# Patient Record
Sex: Male | Born: 1935 | Race: White | Hispanic: No | Marital: Married | Smoking: Former smoker
Health system: Southern US, Community
[De-identification: ages and names within clinical notes are randomized; demographics above are authoritative.]

## PROBLEM LIST (undated history)

## (undated) DIAGNOSIS — I10 Essential (primary) hypertension: Secondary | ICD-10-CM

## (undated) DIAGNOSIS — J449 Chronic obstructive pulmonary disease, unspecified: Secondary | ICD-10-CM

## (undated) DIAGNOSIS — N400 Enlarged prostate without lower urinary tract symptoms: Secondary | ICD-10-CM

## (undated) DIAGNOSIS — E785 Hyperlipidemia, unspecified: Secondary | ICD-10-CM

## (undated) DIAGNOSIS — N289 Disorder of kidney and ureter, unspecified: Secondary | ICD-10-CM

---

## 2007-12-03 ENCOUNTER — Ambulatory Visit: Payer: Self-pay

## 2008-04-22 ENCOUNTER — Ambulatory Visit: Payer: Self-pay

## 2013-10-30 ENCOUNTER — Emergency Department: Payer: Self-pay | Admitting: Emergency Medicine

## 2013-10-30 LAB — CBC WITH DIFFERENTIAL/PLATELET
BASOS PCT: 1.1 %
Basophil #: 0.1 10*3/uL (ref 0.0–0.1)
Eosinophil #: 0.2 10*3/uL (ref 0.0–0.7)
Eosinophil %: 2.3 %
HCT: 51.5 % (ref 40.0–52.0)
HGB: 16.6 g/dL (ref 13.0–18.0)
Lymphocyte #: 1.9 10*3/uL (ref 1.0–3.6)
Lymphocyte %: 27 %
MCH: 29.9 pg (ref 26.0–34.0)
MCHC: 32.2 g/dL (ref 32.0–36.0)
MCV: 93 fL (ref 80–100)
Monocyte #: 0.7 x10 3/mm (ref 0.2–1.0)
Monocyte %: 10.6 %
NEUTROS PCT: 59 %
Neutrophil #: 4.1 10*3/uL (ref 1.4–6.5)
Platelet: 124 10*3/uL — ABNORMAL LOW (ref 150–440)
RBC: 5.55 10*6/uL (ref 4.40–5.90)
RDW: 13.9 % (ref 11.5–14.5)
WBC: 6.9 10*3/uL (ref 3.8–10.6)

## 2013-10-30 LAB — COMPREHENSIVE METABOLIC PANEL
AST: 25 U/L (ref 15–37)
Albumin: 3.8 g/dL (ref 3.4–5.0)
Alkaline Phosphatase: 85 U/L
Anion Gap: 6 — ABNORMAL LOW (ref 7–16)
BUN: 16 mg/dL (ref 7–18)
Bilirubin,Total: 0.7 mg/dL (ref 0.2–1.0)
CHLORIDE: 108 mmol/L — AB (ref 98–107)
CREATININE: 1 mg/dL (ref 0.60–1.30)
Calcium, Total: 9.4 mg/dL (ref 8.5–10.1)
Co2: 28 mmol/L (ref 21–32)
EGFR (African American): >60
EGFR (Non-African Amer.): >60
GLUCOSE: 129 mg/dL — AB (ref 65–99)
Osmolality: 286 (ref 275–301)
Potassium: 4 mmol/L (ref 3.5–5.1)
SGPT (ALT): 24 U/L (ref 12–78)
SODIUM: 142 mmol/L (ref 136–145)
Total Protein: 7.6 g/dL (ref 6.4–8.2)

## 2013-10-30 LAB — PRO B NATRIURETIC PEPTIDE: B-TYPE NATIURETIC PEPTID: 1036 pg/mL — AB (ref 0–450)

## 2013-10-30 LAB — TROPONIN I: Troponin-I: 0.03 ng/mL

## 2013-10-31 ENCOUNTER — Inpatient Hospital Stay: Payer: Self-pay | Admitting: Internal Medicine

## 2013-10-31 LAB — APTT
ACTIVATED PTT: 105.1 s — AB (ref 23.6–35.9)
Activated PTT: 32.6 secs (ref 23.6–35.9)
Activated PTT: 140.4 secs — ABNORMAL HIGH (ref 23.6–35.9)

## 2013-10-31 LAB — CK TOTAL AND CKMB (NOT AT ARMC)
CK, TOTAL: 110 U/L
CK-MB: 8.3 ng/mL — ABNORMAL HIGH (ref 0.5–3.6)

## 2013-10-31 LAB — URINALYSIS, COMPLETE
Bilirubin,UR: NEGATIVE
Glucose,UR: NEGATIVE mg/dL (ref 0–75)
Ketone: NEGATIVE
LEUKOCYTE ESTERASE: NEGATIVE
Nitrite: NEGATIVE
Ph: 5 (ref 4.5–8.0)
Protein: NEGATIVE
Specific Gravity: 1.049 (ref 1.003–1.030)
Squamous Epithelial: NONE SEEN

## 2013-10-31 LAB — TROPONIN I
Troponin-I: 1.5 ng/mL — ABNORMAL HIGH
Troponin-I: 2.1 ng/mL — ABNORMAL HIGH
Troponin-I: 3 ng/mL — ABNORMAL HIGH

## 2013-10-31 LAB — CK-MB
CK-MB: 6.4 ng/mL — ABNORMAL HIGH (ref 0.5–3.6)
CK-MB: 9.3 ng/mL — ABNORMAL HIGH (ref 0.5–3.6)

## 2013-11-01 LAB — LIPID PANEL
Cholesterol: 134 mg/dL (ref 0–200)
HDL Cholesterol: 41 mg/dL (ref 40–60)
Ldl Cholesterol, Calc: 81 mg/dL (ref 0–100)
Triglycerides: 58 mg/dL (ref 0–200)
VLDL Cholesterol, Calc: 12 mg/dL (ref 5–40)

## 2013-11-01 LAB — PLATELET COUNT: PLATELETS: 107 10*3/uL — AB (ref 150–440)

## 2013-11-01 LAB — APTT

## 2013-11-01 LAB — HEMOGLOBIN: HGB: 14.8 g/dL (ref 13.0–18.0)

## 2013-11-07 ENCOUNTER — Ambulatory Visit: Payer: Self-pay | Admitting: Urology

## 2013-11-14 ENCOUNTER — Ambulatory Visit: Payer: Self-pay | Admitting: Urology

## 2013-12-03 ENCOUNTER — Ambulatory Visit: Payer: Self-pay | Admitting: Urology

## 2014-01-15 ENCOUNTER — Ambulatory Visit: Payer: Self-pay | Admitting: Urology

## 2014-12-13 NOTE — Discharge Summary (Signed)
PATIENT NAME:  Timothy Cabrera, Timothy Cabrera MR#:  [id number] DATE OF BIRTH:  Oct 27, 1921  DATE OF ADMISSION:  10/31/2013 DATE OF DISCHARGE:  11/01/2013  PRIMARY CARE PHYSICIAN:  Dr. Peter CongoSchiller.  FINAL DIAGNOSES: 1.  Type II acute myocardial infarction with demand ischemia.  2.  Abdominal pain and kidney stone.  3.  Benign prostatic hypertrophy.  4.  Chronic shortness of breath on chronic oxygen.   MEDICATIONS ON DISCHARGE:  Include Flomax 0.4 mg daily, meclizine 25 mg 3 times a day as needed for dizziness, metoprolol 25 mg extended-release 1 tablet daily, Lipitor 20 mg at bedtime, acetaminophen hydrocodone 325/5 mg 1 tablet every six hours as needed for pain.   HOME HEALTH:  Yes.  Physical therapy, nurse, nurse aide and social worker, help with meds and community resources.   TREATMENT:  Strain urine and save stone.  The patient does have home oxygen at 2 liters nasal cannula.   DIET:  Low sodium diet, regular consistency.   HOSPITAL COURSE:  The patient was admitted 10/31/2013 and discharged 11/01/2013.  This is a strange story because the patient presented with left lower abdominal pain.  He was found to have a kidney stone.  He was given pain medication.  His blood pressure was very high on presentation.  The ER physician drew a first troponin that was negative and they sent him home and then his second troponin came back and was elevated and they called him back for further evaluation.  The patient never had any chest pain.  He does chronically have shortness of breath.  He was admitted with a suspected myocardial infarction and started on heparin drip and given aspirin.  The patient was seen in consultation by Dr. Lady GaryFath covering for Dr. Darrold JunkerParaschos.  Did not recommend any aggressive cardiac testing.  Since the patient was not having chest pain he recommended medical management.   LABORATORY AND RADIOLOGICAL DATA DURING THE HOSPITAL COURSE:  Included an EKG that showed sinus tachycardia, first-degree AV  block, premature atrial complexes.  First troponin negative.  Glucose 129, BUN 16, creatinine 1.0, sodium 142, potassium 4.0, chloride 108, CO2 28, calcium 9.4.  Liver function tests normal range.  White blood cell count 6.9, hemoglobin and hematocrit 16.6 and 51.4, platelet count of 124.  BNP elevated at 1036.  Chest x-ray suboptimal inspiration, cardiomegaly.  CT scan of the abdomen and pelvis showed mild left hydroureter nephrosis to the level of a 6 mm mid left ureter stone.  Urinalysis shows 3+ blood.  Troponin went up to 1.5, next troponin 2.0, next troponin 3.0.  Echocardiogram showed an EF of 60% to 65%, moderate concentric left ventricular hypertrophy, moderately increased left ventricular septal thickness, mildly dilated left atrium, mildly dilated right atrium.  Platelet count upon discharge 107, LDL 81, HDL 41, triglycerides 58, hemoglobin upon discharge 14.8.   HOSPITAL COURSE PER PROBLEM LIST:  1.  For the type II acute myocardial infarction, the patient was seen in consultation by Dr. Lady GaryFath.  He believes this is demand ischemia from the patient's very elevated blood pressure when he came in with the severe abdominal pain.  Medical management was done in the hospital including heparin drip and aspirin and low-dose beta blocker.  Dr. Lady GaryFath recommended medical management at this point.  No aggressive intervention needed.  Follow-up with Dr. Darrold JunkerParaschos as outpatient.  2.  Abdominal pain and kidney stone.  I did speak with Dr. Achilles Dunkope on the phone.  He is unlikely to pass this stone, but he  does recommend giving the patient two weeks to try to pass the stone.  He said aspirin is absolutely contraindicated with this stone in case they have to do lithotripsy or go in and get the stone and needed to hold off on the aspirin at this point in time.  He did recommend Flomax and following up with the urologist who they referred him to upon the first discharge from the Emergency Room.  The family has the paperwork of  that.   3.  Benign prostatic hypertrophy.  Continue Flomax.  4.  Chronic shortness of breath on chronic oxygen at home.   Time spent on discharge 35 minutes.   The patient can start aspirin after stone is passed or cleared by urology to do so.  The patient is at higher risk for myocardial infarction being off the aspirin.    ____________________________ Herschell Dimes. Renae Gloss, MD rjw:ea D: 11/01/2013 15:29:08 ET T: 11/01/2013 23:09:42 ET JOB#: [id number]  cc: Herschell Dimes. Renae Gloss, MD, <Dictator> Dr. [facility] Marcina Millard, MD Darlin Priestly. Lady Gary, MD  Salley Scarlet MD ELECTRONICALLY SIGNED 11/09/2013 15:13

## 2014-12-13 NOTE — Consult Note (Signed)
Brief Consult Note: Diagnosis: 81 yo with presentation for kidney stoke with flank pain and hypertensive reosne. No chest pain. Called back because troponini drawn was abnormal. No chest pian or sob.   Patient was seen by consultant.   Recommend further assessment or treatment.   Orders entered.   Discussed with Attending MD.   Comments: 81 yo with flank pain yesterday noted to have kidney stone. No chest pain but severe flank pain and hypertensive response. Troponin was drawn and pt dischaerged. Called back in due to mildly ele\vated troponin. Third troponin was 3. No ekg changes or chest pain. Echo one year ago with normal ef. Etiology of elevated troponin is not lekly to be an acute coronary event but demand ischemia due to severe pain and hypertension with his renal alculi. Would defer cath at present due to lack of cardiac symtpoms and relative high riskk due to age. ontinue asa an metoprolol for now. Heparin for 12 hours following for evidnece of hematuria. Echo to evaluate lv funciton.  Electronic Signatures: Dalia HeadingFath, Kenneth A (MD)  (Signed 12-Mar-15 12:18)  Authored: Brief Consult Note   Last Updated: 12-Mar-15 12:18 by Dalia HeadingFath, Kenneth A (MD)

## 2014-12-13 NOTE — H&P (Signed)
PATIENT NAME:  Timothy Cabrera, Timothy Cabrera MR#:  [id number] DATE OF BIRTH:  01/10/1922  DATE OF ADMISSION:  10/31/2013  REFERRING PHYSICIAN:  Dr. Bayard Males.   PRIMARY CARE PHYSICIAN:  Dr. Peter Congo.   CHIEF COMPLAINT:  Left lower quadrant pain.   HISTORY OF PRESENT ILLNESS:  An 81 year old Caucasian gentleman without reported medical history, though has not had any PCP for approximately 40 years presenting with left lower quadrant pain.  He was originally evaluated in the Emergency Department.  After initial work-up he was found to have nephrolithiasis and was actually discharged from our facility, however he was advised to return with abnormal lab results, specifically an elevated troponin.  He originally complained of having left lower quadrant pain, sharp, 10 out of 10 in intensity, nonradiating, no worsening or relieving factors.  It started 30 minutes prior to arrival to the Emergency Department; however, all symptoms now currently improved.  He does complain of shortness of breath as well which he describes as chronic in nature.  Denies any chest pain, palpitations, orthopnea.  Positive for some lower extremity edema, currently asymptomatic.   REVIEW OF SYSTEMS:  CONSTITUTIONAL:  Denies fever, fatigue, weakness.  EYES:  Denied blurred vision, double vision, eye pain.  EARS, NOSE, THROAT:  Denies tinnitus, ear pain, hearing loss RESPIRATORY:  Positive for shortness of breath.  Denies cough, wheeze.  CARDIOVASCULAR:  No chest pain, palpitations.  Positive for edema.  GASTROINTESTINAL:  Denies nausea, vomiting, diarrhea, abdominal pain.  GENITOURINARY:  Denies dysuria.  Positive for hematuria.  ENDOCRINE:  Denies nocturia or thyroid problems.  HEMATOLOGY AND LYMPHATIC:  Denies easy bruising, bleeding.  SKIN:  Denies rash or lesion.  MUSCULOSKELETAL:  Denies pain in neck, back, shoulder, knees, hips or arthritic symptoms. NEUROLOGIC:  Denies paralysis, paresthesias.  PSYCHIATRIC:  Denies anxiety  or depressive symptoms.  Otherwise, full review of systems performed by me is negative.   PAST MEDICAL HISTORY:  Tobacco abuse.  No further history.  However, has not seen a PCP and greater than 40 years.   SOCIAL HISTORY:  An 80 pack-year smoking history, has quit about 30 years ago.  Denies any alcohol or drug usage.   FAMILY HISTORY:  Positive for coronary artery disease in his mother of late onset.   ALLERGIES:  No known drug allergies.   HOME MEDICATIONS:  Include Flomax 0.4 mg by mouth daily.   PHYSICAL EXAMINATION: VITAL SIGNS:  Temperature 96.3, heart rate 76, respirations 18, blood pressure 160/94, saturating 91% on room air.  Currently saturating 95% on 2 liters nasal cannula.  Weight 99.8 kg, BMI of 31.6.  GENERAL:  Well-nourished, well-developed, Caucasian gentleman, currently in minimal distress secondary to respiratory status.  HEAD:  Normocephalic, atraumatic.  EYES:  Pupils equal, round, reactive to light.  Extraocular muscles intact.  No scleral icterus.  MOUTH:  Moist mucous membranes.  Dentition intact.  No abscess noted.  EARS, NOSE, THROAT:  Throat clear without exudates.  No external lesions.  NECK:  Supple.  No thyromegaly.  No nodules.  No JVD.  PULMONARY:  Diffuse coarse breath sounds with scattered rhonchi.  No use of accessory muscles.  Good respiratory effort.  Chest nontender to palpation.  CARDIOVASCULAR:  S1, S2, with a 3 out of 6 systolic ejection murmur best heard at right upper sternal border.  Trace edema to the shins bilaterally.  Pedal pulses 2+ bilaterally.  GASTROINTESTINAL:  Soft, nontender, nondistended.  No masses.  Positive bowel sounds.  No hepatosplenomegaly.  MUSCULOSKELETAL:  No  swelling, clubbing.  Positive for edema as described above.  Range of motion full in all extremities.  NEUROLOGIC:  Cranial nerves II through XII intact.  No gross focal neurological deficits.  Sensation intact.  Reflexes intact.  SKIN:  No ulceration, lesions, rashes,  cyanosis.  Skin warm and dry.  Turgor intact.  PSYCHIATRIC:  Mood and affect within normal limits.  The patient is awake, alert, oriented x 3.  Insight and judgment are intact.   LABORATORY DATA:  Sodium 142, potassium 4, chloride 108, bicarb of 28, BUN 16, creatinine 1, glucose 129.  BNP 1036.  LFTs within normal limits.  Troponin I first set 0.03, seconds set at 1.5, CK-MB 6.4, second set.  WBC 6.9, hemoglobin 16.6, platelets of 124.  Urinalysis 2+ WBCs, RBCs 198.  CT of the abdomen performed revealing 6 mm mid left ureteral stone with mild left hydroureter nephrosis.  Chest x-ray performed revealing mild congestive heart failure with cardiomegaly and mild interstitial pulmonary edema.  EKG performed with first-degree AV block, PR interval of 240, otherwise normal sinus rhythm, heart rate 79.  No ST or T wave abnormalities.   ASSESSMENT AND PLAN:  An 81 year old Caucasian gentleman without reported medical history who is presenting with left lower quadrant pain, diagnosed with nephrolithiasis, though had to return to the hospital secondary to elevated troponin.  1.  Non-ST-elevation myocardial infarction with troponin of 1.5, elevated CK-MB as well.  We will place on telemetry and give aspirin, initiate heparin drip with bolus, nitroglycerin as required for chest pain, supplemental O2 to keep oxygen saturation greater than 92%, statin therapy with Lipitor.  We will consult cardiology.  He has seen Dr. Darrold JunkerParaschos in the past.   2.  Nephrolithiasis.  Provide pain control.  Will need urology consult given his hydronephrosis.  3.  Venous thromboembolism prophylaxis.  He will be on a heparin drip.  4.  CODE STATUS:  THE PATIENT IS A FULL CODE.   TIME SPENT:  45 minutes.    ____________________________ Cletis Athensavid K. Hower, MD dkh:ea D: 10/31/2013 04:25:00 ET T: 10/31/2013 04:57:30 ET JOB#: [id number]  cc: Cletis Athensavid K. Hower, MD, <Dictator> DAVID Synetta ShadowK HOWER MD ELECTRONICALLY SIGNED 10/31/2013 20:24

## 2015-08-10 ENCOUNTER — Inpatient Hospital Stay
Admission: EM | Admit: 2015-08-10 | Discharge: 2015-08-13 | DRG: 292 | Disposition: A | Discharge status: Other Acute Inpatient Hospital | Payer: Commercial Managed Care - HMO | Attending: Internal Medicine | Admitting: Internal Medicine

## 2015-08-10 ENCOUNTER — Emergency Department: Payer: Commercial Managed Care - HMO

## 2015-08-10 DIAGNOSIS — I10 Essential (primary) hypertension: Secondary | ICD-10-CM | POA: Diagnosis present

## 2015-08-10 DIAGNOSIS — H9190 Unspecified hearing loss, unspecified ear: Secondary | ICD-10-CM | POA: Diagnosis present

## 2015-08-10 DIAGNOSIS — F0390 Unspecified dementia without behavioral disturbance: Secondary | ICD-10-CM | POA: Diagnosis present

## 2015-08-10 DIAGNOSIS — I482 Chronic atrial fibrillation: Secondary | ICD-10-CM | POA: Diagnosis present

## 2015-08-10 DIAGNOSIS — I739 Peripheral vascular disease, unspecified: Secondary | ICD-10-CM | POA: Diagnosis present

## 2015-08-10 DIAGNOSIS — Z87891 Personal history of nicotine dependence: Secondary | ICD-10-CM

## 2015-08-10 DIAGNOSIS — N400 Enlarged prostate without lower urinary tract symptoms: Secondary | ICD-10-CM | POA: Diagnosis present

## 2015-08-10 DIAGNOSIS — J9611 Chronic respiratory failure with hypoxia: Secondary | ICD-10-CM | POA: Diagnosis present

## 2015-08-10 DIAGNOSIS — I509 Heart failure, unspecified: Secondary | ICD-10-CM

## 2015-08-10 DIAGNOSIS — I872 Venous insufficiency (chronic) (peripheral): Secondary | ICD-10-CM | POA: Diagnosis present

## 2015-08-10 DIAGNOSIS — I5031 Acute diastolic (congestive) heart failure: Principal | ICD-10-CM | POA: Diagnosis present

## 2015-08-10 DIAGNOSIS — J449 Chronic obstructive pulmonary disease, unspecified: Secondary | ICD-10-CM | POA: Diagnosis present

## 2015-08-10 DIAGNOSIS — L97919 Non-pressure chronic ulcer of unspecified part of right lower leg with unspecified severity: Secondary | ICD-10-CM | POA: Diagnosis present

## 2015-08-10 DIAGNOSIS — L97929 Non-pressure chronic ulcer of unspecified part of left lower leg with unspecified severity: Secondary | ICD-10-CM | POA: Diagnosis present

## 2015-08-10 DIAGNOSIS — J810 Acute pulmonary edema: Secondary | ICD-10-CM

## 2015-08-10 DIAGNOSIS — Z9981 Dependence on supplemental oxygen: Secondary | ICD-10-CM

## 2015-08-10 DIAGNOSIS — E785 Hyperlipidemia, unspecified: Secondary | ICD-10-CM | POA: Diagnosis present

## 2015-08-10 HISTORY — DX: Hyperlipidemia, unspecified: E78.5

## 2015-08-10 HISTORY — DX: Essential (primary) hypertension: I10

## 2015-08-10 HISTORY — DX: Chronic obstructive pulmonary disease, unspecified: J44.9

## 2015-08-10 HISTORY — DX: Disorder of kidney and ureter, unspecified: N28.9

## 2015-08-10 HISTORY — DX: Benign prostatic hyperplasia without lower urinary tract symptoms: N40.0

## 2015-08-10 LAB — URINALYSIS COMPLETE WITH MICROSCOPIC (ARMC ONLY)
BILIRUBIN URINE: NEGATIVE
Bacteria, UA: NONE SEEN
GLUCOSE, UA: NEGATIVE mg/dL
Hgb urine dipstick: NEGATIVE
KETONES UR: NEGATIVE mg/dL
LEUKOCYTES UA: NEGATIVE
Nitrite: NEGATIVE
Protein, ur: 30 mg/dL — AB
Specific Gravity, Urine: 1.014 (ref 1.005–1.030)
pH: 5 (ref 5.0–8.0)

## 2015-08-10 LAB — BRAIN NATRIURETIC PEPTIDE: B NATRIURETIC PEPTIDE 5: 1515 pg/mL — AB (ref 0.0–100.0)

## 2015-08-10 LAB — GLUCOSE, CAPILLARY: Glucose-Capillary: 142 mg/dL — ABNORMAL HIGH (ref 65–99)

## 2015-08-10 LAB — BASIC METABOLIC PANEL
Anion gap: 9 (ref 5–15)
BUN: 17 mg/dL (ref 6–20)
CHLORIDE: 106 mmol/L (ref 101–111)
CO2: 28 mmol/L (ref 22–32)
Calcium: 9 mg/dL (ref 8.9–10.3)
Creatinine, Ser: 0.78 mg/dL (ref 0.61–1.24)
GFR calc Af Amer: >60 mL/min (ref >60)
GFR calc non Af Amer: >60 mL/min (ref >60)
Glucose, Bld: 160 mg/dL — ABNORMAL HIGH (ref 65–99)
POTASSIUM: 3.8 mmol/L (ref 3.5–5.1)
SODIUM: 143 mmol/L (ref 135–145)

## 2015-08-10 LAB — CBC
HEMATOCRIT: 47.1 % (ref 40.0–52.0)
Hemoglobin: 15.2 g/dL (ref 13.0–18.0)
MCH: 29.6 pg (ref 26.0–34.0)
MCHC: 32.3 g/dL (ref 32.0–36.0)
MCV: 91.8 fL (ref 80.0–100.0)
Platelets: 114 10*3/uL — ABNORMAL LOW (ref 150–440)
RBC: 5.13 MIL/uL (ref 4.40–5.90)
RDW: 17.1 % — AB (ref 11.5–14.5)
WBC: 5.6 10*3/uL (ref 3.8–10.6)

## 2015-08-10 LAB — TROPONIN I
TROPONIN I: 0.03 ng/mL (ref <0.031)
Troponin I: 0.03 ng/mL (ref <0.031)
Troponin I: 0.03 ng/mL (ref <0.031)

## 2015-08-10 LAB — MAGNESIUM: Magnesium: 2 mg/dL (ref 1.7–2.4)

## 2015-08-10 LAB — CARBAMAZEPINE LEVEL, TOTAL

## 2015-08-10 LAB — TSH: TSH: 2.792 u[IU]/mL (ref 0.350–4.500)

## 2015-08-10 MED ORDER — ONDANSETRON HCL 4 MG/2ML IJ SOLN: 4.0000 mg | Freq: Four times a day (QID) | INTRAMUSCULAR | Status: DC | PRN

## 2015-08-10 MED ORDER — METOPROLOL SUCCINATE ER 25 MG PO TB24
25.0000 mg | ORAL_TABLET | Freq: Every day | ORAL | Status: DC
  Administered 2015-08-11 – 2015-08-13 (×3): 25 mg via ORAL
  Filled 2015-08-10 (×3): qty 1

## 2015-08-10 MED ORDER — TIOTROPIUM BROMIDE MONOHYDRATE 18 MCG IN CAPS
18.0000 ug | ORAL_CAPSULE | Freq: Every day | RESPIRATORY_TRACT | Status: DC
  Administered 2015-08-11 – 2015-08-13 (×3): 18 ug via RESPIRATORY_TRACT
  Filled 2015-08-10: qty 5

## 2015-08-10 MED ORDER — SODIUM CHLORIDE 0.9 % IJ SOLN: 3.0000 mL | INTRAMUSCULAR | Status: DC | PRN

## 2015-08-10 MED ORDER — FUROSEMIDE 10 MG/ML IJ SOLN
40.0000 mg | Freq: Once | INTRAMUSCULAR | Status: AC
  Administered 2015-08-10: 40 mg via INTRAVENOUS
  Filled 2015-08-10: qty 4

## 2015-08-10 MED ORDER — INFLUENZA VAC SPLIT QUAD 0.5 ML IM SUSY
0.5000 mL | PREFILLED_SYRINGE | INTRAMUSCULAR | Status: AC
  Administered 2015-08-11: 0.5 mL via INTRAMUSCULAR
  Filled 2015-08-10: qty 0.5

## 2015-08-10 MED ORDER — ACETAMINOPHEN 650 MG RE SUPP: 650.0000 mg | Freq: Four times a day (QID) | RECTAL | Status: DC | PRN

## 2015-08-10 MED ORDER — CARBAMAZEPINE 100 MG PO CHEW
100.0000 mg | CHEWABLE_TABLET | Freq: Two times a day (BID) | ORAL | Status: DC
  Administered 2015-08-10 – 2015-08-13 (×6): 100 mg via ORAL
  Filled 2015-08-10 (×8): qty 1

## 2015-08-10 MED ORDER — SODIUM CHLORIDE 0.9 % IV SOLN: 250.0000 mL | INTRAVENOUS | Status: DC | PRN

## 2015-08-10 MED ORDER — ONDANSETRON HCL 4 MG PO TABS: 4.0000 mg | ORAL_TABLET | Freq: Four times a day (QID) | ORAL | Status: DC | PRN

## 2015-08-10 MED ORDER — CETYLPYRIDINIUM CHLORIDE 0.05 % MT LIQD
7.0000 mL | Freq: Two times a day (BID) | OROMUCOSAL | Status: DC
  Administered 2015-08-10 – 2015-08-12 (×4): 7 mL via OROMUCOSAL

## 2015-08-10 MED ORDER — SODIUM CHLORIDE 0.9 % IJ SOLN
3.0000 mL | Freq: Two times a day (BID) | INTRAMUSCULAR | Status: DC
  Administered 2015-08-10 – 2015-08-11 (×3): 3 mL via INTRAVENOUS

## 2015-08-10 MED ORDER — TAMSULOSIN HCL 0.4 MG PO CAPS
0.4000 mg | ORAL_CAPSULE | Freq: Every day | ORAL | Status: DC
  Administered 2015-08-11 – 2015-08-13 (×3): 0.4 mg via ORAL
  Filled 2015-08-10 (×3): qty 1

## 2015-08-10 MED ORDER — ACETAMINOPHEN 325 MG PO TABS
650.0000 mg | ORAL_TABLET | Freq: Four times a day (QID) | ORAL | Status: DC | PRN
  Administered 2015-08-12 – 2015-08-13 (×2): 650 mg via ORAL
  Filled 2015-08-10 (×2): qty 2

## 2015-08-10 MED ORDER — HEPARIN SODIUM (PORCINE) 5000 UNIT/ML IJ SOLN
5000.0000 [IU] | Freq: Three times a day (TID) | INTRAMUSCULAR | Status: DC
  Administered 2015-08-10 – 2015-08-13 (×8): 5000 [IU] via SUBCUTANEOUS
  Filled 2015-08-10 (×8): qty 1

## 2015-08-10 MED ORDER — POTASSIUM CHLORIDE CRYS ER 20 MEQ PO TBCR
20.0000 meq | EXTENDED_RELEASE_TABLET | Freq: Every day | ORAL | Status: DC
  Administered 2015-08-10 – 2015-08-13 (×4): 20 meq via ORAL
  Filled 2015-08-10 (×4): qty 1

## 2015-08-10 MED ORDER — NITROGLYCERIN 2 % TD OINT
1.0000 [in_us] | TOPICAL_OINTMENT | Freq: Once | TRANSDERMAL | Status: AC
  Administered 2015-08-10: 1 [in_us] via TOPICAL
  Filled 2015-08-10: qty 1

## 2015-08-10 MED ORDER — LISINOPRIL 5 MG PO TABS
5.0000 mg | ORAL_TABLET | Freq: Every day | ORAL | Status: DC
  Administered 2015-08-10 – 2015-08-13 (×4): 5 mg via ORAL
  Filled 2015-08-10 (×4): qty 1

## 2015-08-10 MED ORDER — ASPIRIN 81 MG PO CHEW
81.0000 mg | CHEWABLE_TABLET | Freq: Every day | ORAL | Status: DC
Start: 2015-08-10 — End: 2015-08-13
  Administered 2015-08-10 – 2015-08-13 (×4): 81 mg via ORAL
  Filled 2015-08-10 (×4): qty 1

## 2015-08-10 MED ORDER — ALBUTEROL SULFATE (2.5 MG/3ML) 0.083% IN NEBU
2.5000 mg | INHALATION_SOLUTION | RESPIRATORY_TRACT | Status: DC | PRN
  Administered 2015-08-12 (×2): 2.5 mg via RESPIRATORY_TRACT
  Filled 2015-08-10 (×2): qty 3

## 2015-08-10 MED ORDER — BRIMONIDINE TARTRATE 0.15 % OP SOLN
1.0000 [drp] | Freq: Two times a day (BID) | OPHTHALMIC | Status: DC
  Administered 2015-08-10 – 2015-08-13 (×6): 1 [drp] via OPHTHALMIC
  Filled 2015-08-10: qty 5

## 2015-08-10 MED ORDER — ATORVASTATIN CALCIUM 20 MG PO TABS
20.0000 mg | ORAL_TABLET | Freq: Every day | ORAL | Status: DC
  Administered 2015-08-10 – 2015-08-12 (×3): 20 mg via ORAL
  Filled 2015-08-10 (×3): qty 1

## 2015-08-10 MED ORDER — FUROSEMIDE 10 MG/ML IJ SOLN
40.0000 mg | Freq: Two times a day (BID) | INTRAMUSCULAR | Status: DC
Start: 2015-08-10 — End: 2015-08-13
  Administered 2015-08-11 – 2015-08-13 (×5): 40 mg via INTRAVENOUS
  Filled 2015-08-10 (×4): qty 4

## 2015-08-10 NOTE — H&P (Addendum)
[facility] [place] [facility]   PATIENT NAME: Timothy Cabrera    MR#:  [id number]  DATE OF BIRTH:  [birth year]  DATE OF ADMISSION:  08/10/2015  PRIMARY CARE PHYSICIAN: Jerl Mina, MD   REQUESTING/REFERRING PHYSICIAN: Jennye Moccasin, MD  CHIEF COMPLAINT:   Chief Complaint  Patient presents with  . Fatigue   generalized weakness, shortness of breath and leg edema one day.  HISTORY OF PRESENT ILLNESS:  Timothy Cabrera  is a 81 y.o. male with a known history of COPD, chronic respiratory failure on home oxygen 2 L and hypertension. The patient was sent to ED from home due to above chief complaint. He is alert, awake and oriented. But he is hard of hearing and a little demented. He is not a good historian. According to his niece, is living with his wife who has Alzheimer's disease. The patient was noticed by his a family member that he was having generalized weakness, shortness of breath and increasing leg edema. So he was sent to the ED for further evaluation. Chest x-ray show pulmonary edema. He was treated with Lasix in the ED. The patient denies any other symptoms.  PAST MEDICAL HISTORY:   Past Medical History  Diagnosis Date  . COPD (chronic obstructive pulmonary disease) (HCC)   . Renal disorder   . Hypertension   . Hyperlipemia   . Enlarged prostate     PAST SURGICAL HISTORY:  No past surgical history on file.  SOCIAL HISTORY:   Social History  Substance Use Topics  . Smoking status: Former Games developer  . Smokeless tobacco: Not on file  . Alcohol Use: No    FAMILY HISTORY:   Family History  Problem Relation Age of Onset  . CAD Mother     DRUG ALLERGIES:  No Known Allergies  REVIEW OF SYSTEMS:  CONSTITUTIONAL: No fever, has generalized weakness.  EYES: No blurred or double vision.  EARS, NOSE, AND THROAT: No tinnitus or ear pain.  RESPIRATORY: Has cough and shortness of breath, wheezing or hemoptysis.  CARDIOVASCULAR: No chest  pain, orthopnea, but has leg edema.  GASTROINTESTINAL: No nausea, vomiting, diarrhea or abdominal pain.  GENITOURINARY: No dysuria, hematuria.  ENDOCRINE: No polyuria, nocturia,  HEMATOLOGY: No anemia, easy bruising or bleeding SKIN: No rash or lesion. MUSCULOSKELETAL: No joint pain or arthritis.   NEUROLOGIC: No tingling, numbness, weakness.  PSYCHIATRY: No anxiety or depression.   MEDICATIONS AT HOME:   Prior to Admission medications   Medication Sig Start Date End Date Taking? Authorizing Provider  atorvastatin (LIPITOR) 20 MG tablet Take 20 mg by mouth at bedtime.   Yes Historical Provider, MD  brimonidine (ALPHAGAN P) 0.1 % SOLN Place 1 drop into both eyes 2 (two) times daily.   Yes Historical Provider, MD  carbamazepine (TEGRETOL) 100 MG chewable tablet Chew 100 mg by mouth 2 (two) times daily.   Yes Historical Provider, MD  furosemide (LASIX) 20 MG tablet Take 20 mg by mouth daily as needed for fluid or edema.   Yes Historical Provider, MD  metoprolol succinate (TOPROL-XL) 25 MG 24 hr tablet Take 25 mg by mouth daily.   Yes Historical Provider, MD  tamsulosin (FLOMAX) 0.4 MG CAPS capsule Take 0.4 mg by mouth daily.   Yes Historical Provider, MD  tiotropium (SPIRIVA) 18 MCG inhalation capsule Place 18 mcg into inhaler and inhale daily.   Yes Historical Provider, MD      VITAL SIGNS:  Blood pressure 167/98, pulse 78, temperature 97.4  F (36.3 C), temperature source Oral, resp. rate 23, height 6' (1.829 m), weight 106.7 kg (235 lb 3.7 oz), SpO2 95 %.  PHYSICAL EXAMINATION:  GENERAL:  81 y.o.-year-old patient lying in the bed with no acute distress.  EYES: Pupils equal, round, reactive to light and accommodation. No scleral icterus. Extraocular muscles intact.  HEENT: Head atraumatic, normocephalic. Oropharynx and nasopharynx clear. Dry oral mucosa. NECK:  Supple, no jugular venous distention. No thyroid enlargement, no tenderness.  LUNGS: Normal breath sounds bilaterally, no  wheezing, but has bilateral basilar rales. No use of accessory muscles of respiration.  CARDIOVASCULAR: S1, S2 normal. No murmurs, rubs, or gallops.  ABDOMEN: Soft, nontender, nondistended. Bowel sounds present. No organomegaly or mass.  EXTREMITIES: Bilateral upper and lower extremity edema 2+, no cyanosis, or clubbing. But has chronic ulcers and chronic skin changes. NEUROLOGIC: Cranial nerves II through XII are intact. Muscle strength 3-4/5 in all extremities. Sensation intact. Gait not checked.  PSYCHIATRIC: The patient is alert and oriented x 3.  SKIN: Has chronic skin changes, rash, lesion and ulcers.   LABORATORY PANEL:   CBC  Recent Labs Lab 08/10/15 1031  WBC 5.6  HGB 15.2  HCT 47.1  PLT 114*   ------------------------------------------------------------------------------------------------------------------  Chemistries   Recent Labs Lab 08/10/15 1031  NA 143  K 3.8  CL 106  CO2 28  GLUCOSE 160*  BUN 17  CREATININE 0.78  CALCIUM 9.0   ------------------------------------------------------------------------------------------------------------------  Cardiac Enzymes  Recent Labs Lab 08/10/15 1031  TROPONINI 0.03   ------------------------------------------------------------------------------------------------------------------  RADIOLOGY:  Dg Chest Port 1 View  08/10/2015  CLINICAL DATA:  Weakness.  Edema. EXAM: PORTABLE CHEST 1 VIEW COMPARISON:  10/30/2013 . FINDINGS: Cardiomegaly with pulmonary vascular prominence and bilateral interstitial prominence noted consistent congestive heart failure. Small left pleural effusion cannot be excluded. Low lung volumes. No pneumothorax. IMPRESSION: Congestive heart failure with pulmonary interstitial edema. Small left pleural effusion cannot be excluded. Electronically Signed   By: Maisie Fushomas  Register   On: 08/10/2015 11:47    EKG:   Orders placed or performed during the hospital encounter of 08/10/15  . ED EKG  .  ED EKG    IMPRESSION AND PLAN:   Acute CHF, unknown Type. I will start Lasix 40 mg IV twice a day, start a CHF protocol, get echocardiogram and cardiology consult, follow-up BMP and magnesium level.  CAD. I will start aspirin and continue   Chronic respiratory failure on home oxygen. Nebulizer when necessary. COPD. Stable Hypertension. Continue Lopressor and add lisinopril. Hyperlipidemia.   BPH History of nephrolithiasis.   All the records are reviewed and case discussed with ED provider. Management plans discussed with the patient, his niece and they are in agreement. The patient has no POA.  CODE STATUS: Full code  TOTAL TIME TAKING CARE OF THIS PATIENT: 62 minutes.    Shaune Pollackhen, Saul Dorsi M.D on 08/10/2015 at 4:18 PM  Between 7am to 6pm - Pager - [phone]  After 6pm go to [email or web address] - password [email or web address] [facility]  [facility]  Office  [phone]  CC: Primary care physician; Jerl MinaHEDRICK, JAMES, MD

## 2015-08-10 NOTE — Care Management Note (Signed)
Case Management Note  Patient Details  Name: Henrene DodgeCharles W Bye MRN: [id number] Date of Birth: 05-Jan-1922  Subjective/Objective:  Patient has VA benefits per patient access. [place] RN for the [place] [facility] [place].She will have the patient sign the waiver and place on the chart for unit clerk to fax.                 Action/Plan:   Expected Discharge Date:                  Expected Discharge Plan:     In-House Referral:     Discharge planning Services     Post Acute Care Choice:    Choice offered to:     DME Arranged:    DME Agency:     HH Arranged:    HH Agency:     Status of Service:     Medicare Important Message Given:    Date Medicare IM Given:    Medicare IM give by:    Date Additional Medicare IM Given:    Additional Medicare Important Message give by:     If discussed at Long Length of Stay Meetings, dates discussed:    Additional Comments:  Berna BueCheryl Jenan Ellegood, RN 08/10/2015, 2:21 PM

## 2015-08-10 NOTE — Progress Notes (Signed)
Patient admitted to unit. Oriented to room, call bell, and staff. Bed in lowest position. Fall safety plan reviewed. Full assessment to Epic. Skin assessment verified with Margaretmary DysJessica Christmas RN. Telemetry box verification with tele clerk and LindenShana NT. Will continue to monitor.

## 2015-08-10 NOTE — ED Provider Notes (Signed)
Time Seen: Approximately 1119  I have reviewed the triage notes  Chief Complaint: Fatigue   History of Present Illness: Timothy Cabrera is a 81 y.o. male who presents via EMS for some generalized weakness, generalized edema, and shortness of breath. Patient apparently is on some home oxygen therapy at 2 L nasal cannula. He was increased to 3 L prior to arrival and denies any chest pain. Patient's very hard of hearing making history and review of systems difficult. The family member who is with the patient states he lives by himself with his wife who is also elderly. Noticed yesterday he had not seen the patient some time and noticed that he was having generalized weakness. The patient was requesting to go to the [facility] for evaluation but the relative could not get him into the vehicle and had trouble ambulating etc. Patient is on chronic Lasix therapy and is noticed to have some increased swelling in both of his lower extremities along with increased shortness of breath and easy exhaustion. The patient denies any chest pain, nausea, vomiting, fever.   Past Medical History  Diagnosis Date  . COPD (chronic obstructive pulmonary disease) (HCC)   . Renal disorder   . Hypertension   . Hyperlipemia   . Enlarged prostate     Patient Active Problem List   Diagnosis Date Noted  . Acute CHF (congestive heart failure) (HCC) 08/10/2015    No past surgical history on file.  No past surgical history on file.  Current Outpatient Rx  Name  Route  Sig  Dispense  Refill  . atorvastatin (LIPITOR) 20 MG tablet   Oral   Take 20 mg by mouth at bedtime.         . brimonidine (ALPHAGAN P) 0.1 % SOLN   Both Eyes   Place 1 drop into both eyes 2 (two) times daily.         . carbamazepine (TEGRETOL) 100 MG chewable tablet   Oral   Chew 100 mg by mouth 2 (two) times daily.         . furosemide (LASIX) 20 MG tablet   Oral   Take 20 mg by mouth daily as needed for fluid or edema.          . metoprolol succinate (TOPROL-XL) 25 MG 24 hr tablet   Oral   Take 25 mg by mouth daily.         . tamsulosin (FLOMAX) 0.4 MG CAPS capsule   Oral   Take 0.4 mg by mouth daily.         Marland Kitchen tiotropium (SPIRIVA) 18 MCG inhalation capsule   Inhalation   Place 18 mcg into inhaler and inhale daily.           Allergies:  Review of patient's allergies indicates no known allergies.  Family History: Family History  Problem Relation Age of Onset  . CAD Mother     Social History: Social History  Substance Use Topics  . Smoking status: Former Games developer  . Smokeless tobacco: None  . Alcohol Use: No     Review of Systems:   10 point review of systems was performed and was otherwise negative:  Constitutional: No fever Eyes: No visual disturbances ENT: No sore throat, ear pain Cardiac: No chest pain Respiratory: No shortness of breath, wheezing, or stridor Abdomen: No abdominal pain, no vomiting, No diarrhea Endocrine: No weight loss, No night sweats Extremities: No peripheral edema, cyanosis Skin: No rashes, easy bruising Neurologic:  No focal weakness, trouble with speech or swollowing Urologic: No dysuria, Hematuria, or urinary frequency   Physical Exam:  ED Triage Vitals  Enc Vitals Group     BP 08/10/15 1026 125/70 mmHg     Pulse Rate 08/10/15 1026 82     Resp 08/10/15 1026 26     Temp 08/10/15 1026 97.4 F (36.3 C)     Temp Source 08/10/15 1026 Oral     SpO2 08/10/15 1026 96 %     Weight 08/10/15 1026 235 lb 3.7 oz (106.7 kg)     Height 08/10/15 1026 6' (1.829 m)     Head Cir --      Peak Flow --      Pain Score --      Pain Loc --      Pain Edu? --      Excl. in GC? --     General: Awake , Alert , and Oriented times 3; GCS 15 Head: Normal cephalic , atraumatic Eyes: Pupils equal , round, reactive to light Nose/Throat: No nasal drainage, patent upper airway without erythema or exudate.  Neck: Supple, Full range of motion, No anterior adenopathy or  palpable thyroid masses Lungs: Show rales auscultated throughout especially in the lower lobes and anteriorly. No rhonchi or wheezing are noted Heart: Regular rate, irregular  rhythm without murmurs , gallops , or rubs Abdomen: Soft, non tender without rebound, guarding , or rigidity; bowel sounds positive and symmetric in all 4 quadrants. No organomegaly .        Extremities: Large bilateral circumferential edema left greater than right. Areas of weeping and skin breakdown especially on the left side. Neurologic: normal ambulation, Motor symmetric without deficits, sensory intact Skin: warm, dry, no rashes   Labs:   All laboratory work was reviewed including any pertinent negatives or positives listed below:  Labs Reviewed  BASIC METABOLIC PANEL - Abnormal; Notable for the following:    Glucose, Bld 160 (*)    All other components within normal limits  CBC - Abnormal; Notable for the following:    RDW 17.1 (*)    Platelets 114 (*)    All other components within normal limits  URINALYSIS COMPLETEWITH MICROSCOPIC ([facility] ONLY) - Abnormal; Notable for the following:    Color, Urine AMBER (*)    APPearance CLEAR (*)    Protein, ur 30 (*)    Squamous Epithelial / LPF 0-5 (*)    All other components within normal limits  GLUCOSE, CAPILLARY - Abnormal; Notable for the following:    Glucose-Capillary 142 (*)    All other components within normal limits  BRAIN NATRIURETIC PEPTIDE - Abnormal; Notable for the following:    B Natriuretic Peptide 1515.0 (*)    All other components within normal limits  CARBAMAZEPINE LEVEL, TOTAL - Abnormal; Notable for the following:    Carbamazepine Lvl <2.0 (*)    All other components within normal limits  TROPONIN I  CBG MONITORING, ED   review of laboratory work shows an elevated BNP  EKG:  ED ECG REPORT I, Jennye MoccasinBrian S Draxton Luu, the attending physician, personally viewed and interpreted this ECG.  Date: 08/10/2015 EKG Time: 1018 Rate: *66 Rhythm:  Atrial fibrillation QRS Axis: normal Intervals: normal ST/T Wave abnormalities: normal Conduction Disutrbances: none Narrative Interpretation:  Old inferior and anterior ischemia    Radiology:    EXAM: PORTABLE CHEST 1 VIEW  COMPARISON: 10/30/2013 .  FINDINGS: Cardiomegaly with pulmonary vascular prominence and bilateral interstitial prominence noted  consistent congestive heart failure. Small left pleural effusion cannot be excluded. Low lung volumes. No pneumothorax.  IMPRESSION: Congestive heart failure with pulmonary interstitial edema. Small left pleural effusion cannot be excluded.    I personally reviewed the radiologic studies    ED Course:  Patient was started on nitroglycerin paste along with IV Lasix. The patient has some mild hypoxia but especially has large amount of peripheral edema. Patient's had some difficulty taking care of himself along with his wife at home. Patient's case was reviewed with the hospitalist team, further disposition and management depends upon their evaluation  Assessment:  Acute pulmonary edema Peripheral edema Atrial fibrillation  Final Clinical Impression  Final diagnoses:  Acute pulmonary edema [facility])     Plan:  Inpatient management           Jennye Moccasin, MD 08/10/15 1530

## 2015-08-10 NOTE — ED Notes (Signed)
Pt comes into the ED via EMS from home with generalized weakness, pt has weeping edema in BL LE.Marland Kitchen. Pt is tachypnic on arrival states his breathing has been like this for 30 days. On 3L [place] continuous.

## 2015-08-10 NOTE — Consult Note (Signed)
[facility] Cardiology  CARDIOLOGY CONSULT NOTE  Patient ID: Timothy Cabrera MRN: [id number] DOB/AGE: Dec 28, 1921 81 y.o.  Admit date: 08/10/2015 Referring Physician Imogene Burn Primary Physician [facility] Primary Cardiologist Paraschos Reason for Consultation congestive heart failure  HPI: 81 year old gentleman referred for evaluation of congestive heart failure. The patient has known COPD on home O2. He said a recent history of peripheral edema refractory to outpatient furosemide. The patient presented to [facility] emergency room was noted to be in respiratory distress, hypoxic with peripheral edema. Admission lab work was noted for negative troponin. EKG is nondiagnostic. Chest x-ray is consistent with pulmonary edema and congestive heart failure. Review his echocardiogram 10/31/13 revealed normal left ventricular function.  Review of systems complete and found to be negative unless listed above     Past Medical History  Diagnosis Date  . COPD (chronic obstructive pulmonary disease) (HCC)   . Renal disorder   . Hypertension   . Hyperlipemia   . Enlarged prostate     No past surgical history on file.  Prescriptions prior to admission  Medication Sig Dispense Refill Last Dose  . atorvastatin (LIPITOR) 20 MG tablet Take 20 mg by mouth at bedtime.   08/09/2015 at pm  . brimonidine (ALPHAGAN P) 0.1 % SOLN Place 1 drop into both eyes 2 (two) times daily.   08/10/2015 at am  . carbamazepine (TEGRETOL) 100 MG chewable tablet Chew 100 mg by mouth 2 (two) times daily.   08/10/2015 at am  . furosemide (LASIX) 20 MG tablet Take 20 mg by mouth daily as needed for fluid or edema.   PRN  . metoprolol succinate (TOPROL-XL) 25 MG 24 hr tablet Take 25 mg by mouth daily.   08/10/2015 at 0800  . tamsulosin (FLOMAX) 0.4 MG CAPS capsule Take 0.4 mg by mouth daily.   08/10/2015 at am  . tiotropium (SPIRIVA) 18 MCG inhalation capsule Place 18 mcg into inhaler and inhale daily.   08/10/2015 at am   Social History   Social  History  . Marital Status: Married    Spouse Name: N/A  . Number of Children: N/A  . Years of Education: N/A   Occupational History  . Not on file.   Social History Main Topics  . Smoking status: Former Games developer  . Smokeless tobacco: Not on file  . Alcohol Use: No  . Drug Use: Not on file  . Sexual Activity: Not on file   Other Topics Concern  . Not on file   Social History Narrative  . No narrative on file    Family History  Problem Relation Age of Onset  . CAD Mother       Review of systems complete and found to be negative unless listed above      PHYSICAL EXAM  General: Well developed, well nourished, in no acute distress HEENT:  Normocephalic and atramatic Neck:  No JVD.  Lungs: Clear bilaterally to auscultation and percussion. Heart: HRRR . Normal S1 and S2 without gallops or murmurs.  Abdomen: Bowel sounds are positive, abdomen soft and non-tender  Msk:  Back normal, normal gait. Normal strength and tone for age. Extremities: No clubbing, cyanosis or edema.   Neuro: Alert and oriented X 3. Psych:  Good affect, responds appropriately  Labs:   Lab Results  Component Value Date   WBC 5.6 08/10/2015   HGB 15.2 08/10/2015   HCT 47.1 08/10/2015   MCV 91.8 08/10/2015   PLT 114* 08/10/2015    Recent Labs Lab 08/10/15 1031  NA 143  K 3.8  CL 106  CO2 28  BUN 17  CREATININE 0.78  CALCIUM 9.0  GLUCOSE 160*   Lab Results  Component Value Date   CKMB 9.3* 10/31/2013   TROPONINI 0.03 08/10/2015    Lab Results  Component Value Date   CHOL 134 11/01/2013   Lab Results  Component Value Date   HDL 41 11/01/2013   Lab Results  Component Value Date   LDLCALC 81 11/01/2013   Lab Results  Component Value Date   TRIG 58 11/01/2013   No results found for: CHOLHDL No results found for: LDLDIRECT    Radiology: Dg Chest Port 1 View  08/10/2015  CLINICAL DATA:  Weakness.  Edema. EXAM: PORTABLE CHEST 1 VIEW COMPARISON:  10/30/2013 . FINDINGS:  Cardiomegaly with pulmonary vascular prominence and bilateral interstitial prominence noted consistent congestive heart failure. Small left pleural effusion cannot be excluded. Low lung volumes. No pneumothorax. IMPRESSION: Congestive heart failure with pulmonary interstitial edema. Small left pleural effusion cannot be excluded. Electronically Signed   By: Maisie Fushomas  Register   On: 08/10/2015 11:47    EKG: Atrial fibrillation  ASSESSMENT AND PLAN:   1. Congestive heart failure, probable acute diastolic 2. COPD 3. Chronic atrial fibrillation  Recommendations  1. Agree with overall current therapy 2. Continue diuresis with intravenous furosemide 3. Review 2-D echocardiogram 4. Further recommendations pending echocardiogram results    Signed: PARASCHOS,ALEXANDER MD,PhD, [facility]FACC 08/10/2015, 5:20 PM

## 2015-08-11 ENCOUNTER — Inpatient Hospital Stay
Admit: 2015-08-11 | Discharge: 2015-08-11 | Disposition: A | Discharge status: Home / Self Care | Payer: Commercial Managed Care - HMO | Attending: Internal Medicine | Admitting: Internal Medicine

## 2015-08-11 DIAGNOSIS — I5031 Acute diastolic (congestive) heart failure: Secondary | ICD-10-CM | POA: Diagnosis not present

## 2015-08-11 LAB — TROPONIN I: TROPONIN I: 0.03 ng/mL (ref <0.031)

## 2015-08-11 LAB — BASIC METABOLIC PANEL
ANION GAP: 5 (ref 5–15)
BUN: 18 mg/dL (ref 6–20)
CALCIUM: 8.8 mg/dL — AB (ref 8.9–10.3)
CO2: 33 mmol/L — AB (ref 22–32)
CREATININE: 0.92 mg/dL (ref 0.61–1.24)
Chloride: 105 mmol/L (ref 101–111)
GFR calc Af Amer: >60 mL/min (ref >60)
GFR calc non Af Amer: >60 mL/min (ref >60)
Glucose, Bld: 100 mg/dL — ABNORMAL HIGH (ref 65–99)
POTASSIUM: 4 mmol/L (ref 3.5–5.1)
Sodium: 143 mmol/L (ref 135–145)

## 2015-08-11 MED ORDER — HYDROCERIN EX CREA
TOPICAL_CREAM | Freq: Two times a day (BID) | CUTANEOUS | Status: DC
  Filled 2015-08-11: qty 113

## 2015-08-11 NOTE — Consult Note (Signed)
WOC wound consult note Reason for Consult:Patient with dermatological condition consistent with eczema or psoriasis, patient is not a good historian and there is no family in room. Full thickness ulcer on the RLE, lateral aspect. Wound type: Dermatologic condition illustrated by extreme dryness, flaking of all 4 extremities.  Full thickness ulcer is consistent with venous insufficiency etiology, surrounding small, punctate ulcers are consistent with arterial insufficiency. Pressure Ulcer POA: No Measurement: Right lateral LE:  3cm x 2.5cm with depth unable to be determined by the presence of soft yellow/white slough (chronic inflammation). All extremities with dry, flaking skin with evidence of scratching (linear excoriations). Wound bed:As described above.  Others are punctate, small with dry wound beds (suggestive of arterial insufficiency) Drainage (amount, consistency, odor) Serous Periwound:Erythematous, edematous. Dressing procedure/placement/frequency: I will implement today a POC to include twice daily application of Eucerin cream to all extremities, followed by application of a moist saline dressing to the right lateral LE full thickness ulcer. Following this intervention, LEs will be wrapped with Kerlix roll gauze and then this is to be topped by an ACE elastic bandage wrapped from toe to knee. Heels will be protected by the palcement of bilateral Prevalon pressure redistribution boots. WOC nursing team will not follow, but will remain available to this patient, the nursing and medical teams.  Please re-consult if needed. Thanks, Ladona MowLaurie Keylor Rands, MSN, RN, GNP, Hans EdenCWOCN, CWON-AP, FAAN  Pager# [phone]

## 2015-08-11 NOTE — Progress Notes (Signed)
[facility] - [place] at [facility]   PATIENT NAME: Timothy Cabrera    MR#:  [id number]  DATE OF BIRTH:  Oct 05, 1921  DATE OF ADMISSION:  08/10/2015 ADMITTING PHYSICIAN: Shaune Pollack, MD  DATE OF DISCHARGE: 08/11/15  PRIMARY CARE PHYSICIAN: Jerl Mina, MD    ADMISSION DIAGNOSIS:  Acute pulmonary edema (HCC) [J81.0]  DISCHARGE DIAGNOSIS:  Acute pulmonary edema secondary to new onset congestive heart failure. Echo done results pending Bilateral severe lower extremity edema with venous ulcers COPD BPH Hypertension  SECONDARY DIAGNOSIS:   Past Medical History  Diagnosis Date  . COPD (chronic obstructive pulmonary disease) (HCC)   . Renal disorder   . Hypertension   . Hyperlipemia   . Enlarged prostate     HOSPITAL COURSE:  Timothy Cabrera is a 81 y.o. male with a known history of COPD, chronic respiratory failure on home oxygen 2 L and hypertension. The patient was sent to ED from home due to above chief complaint. He is alert, awake and oriented. But he is hard of hearing and a little demented.   1. Acute CHF with pulmonary edema and cardiomegaly noted on chest x-ray  - Lasix 40 mg IV twice a day  patient tolerating diuresing well. His put out about 2100 cc. Creatinine stable at 0.9.  Continue lisinopril and by mouth aspirin.  -Pending  echocardiogram results  -cardiology consult with Dr Cassie Freer  2. Bilateral severe lower extremity edema with venous ulcers. Patient likely has underlying peripheral vascular disease. -White count normal. No evidence of cellulitis. -wound care consult   3.Chronic respiratory failure on home oxygen. Nebulizer when necessary.  4. Hypertension. Continue Lopressor and add lisinopril.  5. Hyperlipidemia.  -On statins  6. BPH On Flomax  Sp[facility] with Dr Alison Murray at [facility] and has accepted pt for transfer to [facility]  CONSULTS OBTAINED:  Treatment Team:  Marcina Millard, MD Dalia Heading, MD  DRUG  ALLERGIES:  No Known Allergies  DISCHARGE MEDICATIONS:   Current Discharge Medication List    CONTINUE these medications which have NOT CHANGED   Details  atorvastatin (LIPITOR) 20 MG tablet Take 20 mg by mouth at bedtime.    brimonidine (ALPHAGAN P) 0.1 % SOLN Place 1 drop into both eyes 2 (two) times daily.    carbamazepine (TEGRETOL) 100 MG chewable tablet Chew 100 mg by mouth 2 (two) times daily.    furosemide (LASIX) 20 MG tablet Take 20 mg by mouth daily as needed for fluid or edema.    metoprolol succinate (TOPROL-XL) 25 MG 24 hr tablet Take 25 mg by mouth daily.    tamsulosin (FLOMAX) 0.4 MG CAPS capsule Take 0.4 mg by mouth daily.    tiotropium (SPIRIVA) 18 MCG inhalation capsule Place 18 mcg into inhaler and inhale daily.        If you experience worsening of your admission symptoms, develop shortness of breath, life threatening emergency, suicidal or homicidal thoughts you must seek medical attention immediately by calling 911 or calling your MD immediately  if symptoms less severe.  You Must read complete instructions/literature along with all the possible adverse reactions/side effects for all the Medicines you take and that have been prescribed to you. Take any new Medicines after you have completely understood and accept all the possible adverse reactions/side effects.   Please note  You were cared for by a hospitalist during your hospital stay. If you have any questions about your discharge medications or the care you received while you were  in the hospital after you are discharged, you can call the unit and asked to speak with the hospitalist on call if the hospitalist that took care of you is not available. Once you are discharged, your primary care physician will handle any further medical issues. Please note that NO REFILLS for any discharge medications will be authorized once you are discharged, as it is imperative that you return to your primary care physician  (or establish a relationship with a primary care physician if you do not have one) for your aftercare needs so that they can reassess your need for medications and monitor your lab values. Today   SUBJECTIVE   Shortness of breath  VITAL SIGNS:  Blood pressure 102/60, pulse 48, temperature 97.5 F (36.4 C), temperature source Oral, resp. rate 22, height 6' (1.829 m), weight 102.014 kg (224 lb 14.4 oz), SpO2 97 %.  I/O:   Intake/Output Summary (Last 24 hours) at 08/11/15 1527 Last data filed at 08/11/15 1400  Gross per 24 hour  Intake   1080 ml  Output   2975 ml  Net  -1895 ml    PHYSICAL EXAMINATION:  GENERAL:  81 y.o.-year-old patient lying in the bed with no acute distress.  EYES: Pupils equal, round, reactive to light and accommodation. No scleral icterus. Extraocular muscles intact.  HEENT: Head atraumatic, normocephalic. Oropharynx and nasopharynx clear.  NECK:  Supple, no jugular venous distention. No thyroid enlargement, no tenderness.  LUNGS: Distant breath sounds bilaterally, no wheezing, rales,rhonchi or crepitation. No use of accessory muscles of respiration.  CARDIOVASCULAR: S1, S2 normal. No murmurs, rubs, or gallops.  ABDOMEN: Soft, non-tender, non-distended. Bowel sounds present. No organomegaly or mass.  EXTREMITIES: Bilateral 3+ pitting edema with significant dry and scaly skin. Patient has several venous ulcers over both his lower extremities. No evidence of cellulitis. Slough present. NEUROLOGIC: Cranial nerves II through XII are intact. Muscle strength 4/5 in all extremities. Sensation intact. Gait not checked.  PSYCHIATRIC: The patient is alert and oriented x 3.  SKIN: No obvious rash, lesion, or ulcer.   DATA REVIEW:   CBC   Recent Labs Lab 08/10/15 1031  WBC 5.6  HGB 15.2  HCT 47.1  PLT 114*    Chemistries   Recent Labs Lab 08/10/15 1643 08/11/15 0429  NA  --  143  K  --  4.0  CL  --  105  CO2  --  33*  GLUCOSE  --  100*  BUN  --  18   CREATININE  --  0.92  CALCIUM  --  8.8*  MG 2.0  --     Microbiology Results   No results found for this or any previous visit (from the past 240 hour(s)).  RADIOLOGY:  Dg Chest Port 1 View  08/10/2015  CLINICAL DATA:  Weakness.  Edema. EXAM: PORTABLE CHEST 1 VIEW COMPARISON:  10/30/2013 . FINDINGS: Cardiomegaly with pulmonary vascular prominence and bilateral interstitial prominence noted consistent congestive heart failure. Small left pleural effusion cannot be excluded. Low lung volumes. No pneumothorax. IMPRESSION: Congestive heart failure with pulmonary interstitial edema. Small left pleural effusion cannot be excluded. Electronically Signed   By: Maisie Fushomas  Register   On: 08/10/2015 11:47     Management plans discussed with the patient, family and they are in agreement.  CODE STATUS:     Code Status Orders        Start     Ordered   08/10/15 1630  Full code   Continuous  08/10/15 1629      TOTAL TIME TAKING CARE OF THIS PATIENT: 40 minutes.    Timothy Cabrera M.D on 08/11/2015 at 3:27 PM  Between 7am to 6pm - Pager - [phone] After 6pm go to [email or web address] - password [email or web address] [facility]  [facility]  Office  [phone]  CC: Primary care physician; Jerl Mina, MD

## 2015-08-11 NOTE — NC FL2 (Signed)
[place] MEDICAID FL2 LEVEL OF CARE SCREENING TOOL     IDENTIFICATION  Patient Name: Timothy Cabrera Birthdate: April 14, 1922 Sex: male Admission Date (Current Location): 08/10/2015  [place] and [place] Number:  Chiropodist and Address:  [facility], [address] [place], [place] [address]      Provider Number: [id number]  Attending Physician Name and Address:  Enedina Finner, MD  Relative Name and Phone Number:       Current Level of Care: Hospital Recommended Level of Care: Skilled Nursing Facility Prior Approval Number:    Date Approved/Denied:   PASRR Number:   [id number]   Discharge Plan: SNF    Current Diagnoses: Patient Active Problem List   Diagnosis Date Noted  . Acute CHF (congestive heart failure) (HCC) 08/10/2015    Orientation RESPIRATION BLADDER Height & Weight    Self, Time, Situation, Place  O2 (Nasal Cannula 3 (L/Min)) Continent 6' (182.9 cm) 224 lbs.  BEHAVIORAL SYMPTOMS/MOOD NEUROLOGICAL BOWEL NUTRITION STATUS   (None)  (none ) Continent Supplemental (Diet Heart Healthy Fluid consistency:: Thin )  AMBULATORY STATUS COMMUNICATION OF NEEDS Skin   Extensive Assist Verbally Normal                       Personal Care Assistance Level of Assistance  Bathing, Feeding, Dressing Bathing Assistance: Limited assistance Feeding assistance: Limited assistance Dressing Assistance: Limited assistance     Functional Limitations Info  Hearing, Sight, Speech Sight Info: Impaired (Patient had glasses at beside.) Hearing Info: Impaired (Patient is hard at hearing. ) Speech Info: Adequate    SPECIAL CARE FACTORS FREQUENCY  PT (By licensed PT)     PT Frequency: 5              Contractures      Additional Factors Info  Code Status Code Status Info:  (Full Code.)             Current Medications (08/11/2015):  This is the current hospital active medication list Current Facility-Administered  Medications  Medication Dose Route Frequency Provider Last Rate Last Dose  . 0.9 %  sodium chloride infusion  250 mL Intravenous PRN Shaune Pollack, MD      . acetaminophen (TYLENOL) tablet 650 mg  650 mg Oral Q6H PRN Shaune Pollack, MD       Or  . acetaminophen (TYLENOL) suppository 650 mg  650 mg Rectal Q6H PRN Shaune Pollack, MD      . albuterol (PROVENTIL) (2.5 MG/3ML) 0.083% nebulizer solution 2.5 mg  2.5 mg Nebulization Q2H PRN Shaune Pollack, MD      . antiseptic oral rinse (CPC / CETYLPYRIDINIUM CHLORIDE 0.05%) solution 7 mL  7 mL Mouth Rinse BID Shaune Pollack, MD   7 mL at 08/11/15 1000  . aspirin chewable tablet 81 mg  81 mg Oral Daily Shaune Pollack, MD   81 mg at 08/11/15 0859  . atorvastatin (LIPITOR) tablet 20 mg  20 mg Oral QHS Shaune Pollack, MD   20 mg at 08/10/15 2209  . brimonidine (ALPHAGAN) 0.15 % ophthalmic solution 1 drop  1 drop Both Eyes BID Shaune Pollack, MD   1 drop at 08/11/15 0900  . carbamazepine (TEGRETOL) chewable tablet 100 mg  100 mg Oral BID Shaune Pollack, MD   100 mg at 08/11/15 0859  . furosemide (LASIX) injection 40 mg  40 mg Intravenous Q12H Shaune Pollack, MD   40 mg at 08/11/15 0553  . heparin injection 5,000  Units  5,000 Units Subcutaneous 3 times per day Shaune PollackQing Chen, MD   5,000 Units at 08/11/15 [id number]  . lisinopril (PRINIVIL,ZESTRIL) tablet 5 mg  5 mg Oral Daily Shaune PollackQing Chen, MD   5 mg at 08/11/15 0859  . metoprolol succinate (TOPROL-XL) 24 hr tablet 25 mg  25 mg Oral Daily Shaune PollackQing Chen, MD   25 mg at 08/11/15 0859  . ondansetron (ZOFRAN) tablet 4 mg  4 mg Oral Q6H PRN Shaune PollackQing Chen, MD       Or  . ondansetron [facility](ZOFRAN) injection 4 mg  4 mg Intravenous Q6H PRN Shaune PollackQing Chen, MD      . potassium chloride SA (K-DUR,KLOR-CON) CR tablet 20 mEq  20 mEq Oral Daily Shaune PollackQing Chen, MD   20 mEq at 08/11/15 0859  . sodium chloride 0.9 % injection 3 mL  3 mL Intravenous Q12H Shaune PollackQing Chen, MD   3 mL at 08/11/15 1000  . sodium chloride 0.9 % injection 3 mL  3 mL Intravenous PRN Shaune PollackQing Chen, MD      . tamsulosin [facility](FLOMAX) capsule 0.4 mg   0.4 mg Oral Daily Shaune PollackQing Chen, MD   0.4 mg at 08/11/15 0859  . tiotropium (SPIRIVA) inhalation capsule 18 mcg  18 mcg Inhalation Daily Shaune PollackQing Chen, MD   18 mcg at 08/11/15 0900     Discharge Medications: Please see discharge summary for a list of discharge medications.  Relevant Imaging Results:  Relevant Lab Results:   Additional Information  (SSN: [id number])  Verta Ellenhristina E Sunkins, LCSW

## 2015-08-11 NOTE — Progress Notes (Signed)
Pt offered breathing treatment.  Pt says he doesn't care either way.  Pt seems to be breathing better than prior to shift.  Respiratory called[name], will give treatment when she rounds.  Will continue to monitor. Timothy Cabrera, [name]

## 2015-08-11 NOTE — Progress Notes (Signed)
*  PRELIMINARY RESULTS* Echocardiogram 2D Echocardiogram has been performed.  Timothy HousekeeperJerry R Cabrera 08/11/2015, 3:33 PM

## 2015-08-11 NOTE — Clinical Social Work Placement (Signed)
   CLINICAL SOCIAL WORK PLACEMENT  NOTE  Date:  08/11/2015  Patient Details  Name: Timothy Cabrera MRN: [id number] Date of Birth: 1922/08/02  Clinical Social Work is seeking post-discharge placement for this patient at the Skilled  Nursing Facility level of care (*CSW will initial, date and re-position this form in  chart as items are completed):  Yes   Patient/family provided with [facility] Clinical Social Work Department's list of facilities offering this level of care within the geographic area requested by the patient (or if unable, by the patient's family).  Yes   Patient/family informed of their freedom to choose among providers that offer the needed level of care, that participate in Medicare, Medicaid or managed care program needed by the patient, have an available bed and are willing to accept the patient.  Yes   Patient/family informed of [facility]'s ownership interest in [facility] and [facility], as well as of the fact that they are under no obligation to receive care at these facilities.  PASRR submitted to EDS on 08/11/15     PASRR number received on 08/11/15     Existing PASRR number confirmed on       FL2 transmitted to all facilities in geographic area requested by pt/family on 08/11/15     FL2 transmitted to all facilities within larger geographic area on       Patient informed that his/her managed care company has contracts with or will negotiate with certain facilities, including the following:            Patient/family informed of bed offers received.  Patient chooses bed at       Physician recommends and patient chooses bed at      Patient to be transferred to   on  .  Patient to be transferred to facility by       Patient family notified on   of transfer.  Name of family member notified:        PHYSICIAN       Additional Comment:    _______________________________________________ Haig ProphetMorgan, Talayeh Bruinsma G, LCSW 08/11/2015, 2:18 PM

## 2015-08-11 NOTE — Evaluation (Signed)
Physical Therapy Evaluation Patient Details Name: Timothy Cabrera MRN: [id number] DOB: [birth year] Today's Date: 08/11/2015   History of Present Illness  Pt admitted for acute CHF with complaints of SOB and generalized weakness. Pt also with noted B LE edema and flaky skin. Pt with history of COPD, CRF, HTN and is currently on chronic home 2L of O2.   Clinical Impression  Pt is a pleasant 81 year old male who was admitted for acute CHF. Pt performs bed mobility with mod assist and is unable to further complete mobility secondary to weakness and SOB symptoms. Pt with sats at 94% while on 3L of O2. Pt demonstrates deficits with strength/endurance/mobility. Would benefit from skilled PT to address above deficits and promote optimal return to PLOF      Follow Up Recommendations SNF    Equipment Recommendations       Recommendations for Other Services       Precautions / Restrictions Precautions Precautions: Fall Restrictions Weight Bearing Restrictions: No      Mobility  Bed Mobility Overal bed mobility: Needs Assistance;+2 for physical assistance Bed Mobility: Supine to Sit     Supine to sit: Mod assist     General bed mobility comments: bed mobility performed with mod assist and once seated at EOB, pt becomes SOB, unable to talk. He is able to sit for approx 3-4 min and then requires +2 for transfer back supine  Transfers                 General transfer comment: unable at this time secondary to weakness and SOB  Ambulation/Gait                Stairs            Wheelchair Mobility    Modified Rankin (Stroke Patients Only)       Balance Overall balance assessment: Needs assistance Sitting-balance support: Bilateral upper extremity supported;Feet supported Sitting balance-Leahy Scale: Good                                       Pertinent Vitals/Pain Pain Assessment: No/denies pain    Home Living Family/patient expects to  be discharged to:: Private residence Living Arrangements: Spouse/significant other (spouse in room -with dementia) Available Help at Discharge: Family Type of Home: House Home Access: Stairs to enter Entrance Stairs-Rails: Left Entrance Stairs-Number of Steps: 4 Home Layout: One level Home Equipment: Environmental consultant - 2 wheels      Prior Function Level of Independence: Independent with assistive device(s) (recently has needed increased assistance from neighbors)         Comments: limited household ambulator     Hand Dominance        Extremity/Trunk Assessment   Upper Extremity Assessment: Generalized weakness (L UE grossly 2/5 with limited shoulder ROM; R UE grossly 3/5)           Lower Extremity Assessment:  (R LE grossly 2+/5; L LE grossly 3/5)         Communication   Communication: HOH (hard to communicate secondary to SOB symptoms)  Cognition Arousal/Alertness: Awake/alert Behavior During Therapy: WFL for tasks assessed/performed Overall Cognitive Status: Within Functional Limits for tasks assessed                      General Comments General comments (skin integrity, edema, etc.): increased B LE edema and dryness.  Exercises Other Exercises Other Exercises: supine ther-ex performed including B ankle pumps (limited ROM), quad sets, SLRs, and hip abd/add. All ther-ex performed x 10 reps with min assist for R LE. Cues given for correct technique      Assessment/Plan    PT Assessment Patient needs continued PT services  PT Diagnosis Difficulty walking;Generalized weakness   PT Problem List Decreased strength;Decreased balance;Decreased mobility  PT Treatment Interventions Gait training;Therapeutic exercise   PT Goals (Current goals can be found in the Care Plan section) Acute Rehab PT Goals Patient Stated Goal: to get stronger PT Goal Formulation: With patient Time For Goal Achievement: 08/25/15 Potential to Achieve Goals: Fair    Frequency Min  2X/week   Barriers to discharge Inaccessible home environment;Decreased caregiver support      Co-evaluation               End of Session Equipment Utilized During Treatment: Oxygen Activity Tolerance: Patient limited by fatigue Patient left: in bed;with bed alarm set Nurse Communication: Mobility status         Time: 1421-1500 PT Time Calculation (min) (ACUTE ONLY): 39 min   Charges:   PT Evaluation $Initial PT Evaluation Tier I: 1 Procedure PT Treatments $Therapeutic Exercise: 8-22 mins   PT G Codes:        Juan Olthoff 08/11/2015, 4:47 PM  Elizabeth PalauStephanie Minh Jasper, PT, DPT [phone]

## 2015-08-11 NOTE — Care Management (Signed)
Patient is agreeable now for [facility].  Have initiated referral through [facility].  Peyton NajjarLarry has received the referral and has "sent it on up"  Informed that the [facility] is not on diversion and there are available beds.

## 2015-08-11 NOTE — Care Management (Signed)
Informed by Dr Enedina FinnerSona Patel that she has been informed that there is an accepting physician at [facility]  Dr Tilda FrancoMcNanigle.  Informed Licensed conveyancerunit secretary and primary nurse.  The facility should call the nursing unit with the bed.  Instructed staff would need to call care link for transport.  If that agency is not able to transport- would need to call [facility].  Request to have chest xray results place on CD.  Placed call to [facility] at business day follow up transfer.  Informed that the coordinator is now going around to look at availalbe beds and will communicate that to the transfer center staff.  Transfer center staff will then call the nursing unit when bed assignment is made.  Dr Allena KatzPatel will prepare the discharge summary and [place] form.

## 2015-08-11 NOTE — Care Management (Signed)
Patient admitted from ED last pm for weakness and shortness of breath. .  CM was approached by a neighbor who voiced concern of patient's ability to care for himself much less his wife who has  alzheimer's.  Neighbor reported concern that patient is not able to walk and patient and wife are now in very poor hygiene and living conditions.  At present, neighbors are "watching out for patient's wife."  This neighbor reports that patient and his wife do no have any family support in the area.  CM spoke with Scherrie NovemberEmily Inge- niece of patient's wife who until 6 months ago provided in home support, transportation and meals.  She has moved out of the county and now is unable to provide assist.  Says that patient has a nieces in [place] and [place].  He has VA benefits and it is reported he was informed by em[facility] [place] [facility].  There is a signed Refusal to transfer form in his medical record.  CSW is involved and at present time speaking with the [facility] about [facility] [place] [facility]

## 2015-08-11 NOTE — Clinical Social Work Note (Signed)
Clinical Social Work Assessment  Patient Details  Name: Timothy Cabrera MRN: [id number] Date of Birth: March 02, 1922  Date of referral:  08/11/15               Reason for consult:  Facility Placement, [employer]                Permission sought to share information with:  Chartered certified accountant granted to share information::  Yes, Verbal Permission Granted  Name::      Timothy::   Cabrera   Relationship::     Contact Information:     Housing/Transportation Living arrangements for the past 2 months:  [place] of Information:  Patient, Other (Comment Required) (Patient's Nephew Timothy Cabrera [employer] was at bedside. ) Patient Interpreter Needed:  None Criminal Activity/Legal Involvement Pertinent to Current Situation/Hospitalization:  No - Comment as needed Significant Relationships:  Spouse, Other Family Members Lives with:  Spouse Do you feel safe going back to the place where you live?  Yes Need for family participation in patient care:  Yes (Comment)  Care giving concerns: Patient lives with his wife in [place].    Social Worker assessment / plan:  Holiday representative (CSW) received SNF consult. PT is pending. CSW met with patient and his nephew Timothy Cabrera [phone] was at bedside. CSW introduced self and explained role of CSW department. Patient is very hard of hearing. 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 Patient reported that his wife is 35 y.o and has dementia. Patient reported that he is the primary caregiver for his wife but he needs help. Per patient they have no children and no other family members in the area. Patient reported that he does not have a lot of support in the community. Per nephew he helps as much as he can and a cousin Timothy Cabrera [phone] has been staying with patient's wife the past 2 days. Per nephew  Timothy Cabrera cannot keep staying with patient and his wife all the time. Per nephew patient has written a letter to legislator Timothy Cabrera for assistance for him and his wife. CSW provided emotional support and explained SNF options. Patient reported that he would be agreeable to going to short term rehab if he had to. CSW asked if patient would like to transfer to [facility] and patient said yes. Per nephew he was going to take patient to the [facility] hospital yesterday when he got to his home but patient's legs were swollen and he couldn't get him in the car. Nephew then called 911 and patient was brought to [facility]. CSW explained that patient may have VA benefits for in home care and SNF availbale to him and his wife. Patient requested to transfer to the [place]. RN Case Manager is aware of above and is working on [place] transfer.   CSW contacted Timothy Cabrera at the [facility] and made him aware of above. Per Timothy Cabrera patient's primary care phsycian at the [facility] is Dr. Delorise Cabrera. Per Timothy Cabrera they are not on bed diversion and they do have beds available. Per Timothy Cabrera patient's primary care physician can assist patient in getting in home services as needed for him and his wife. Per Timothy Cabrera patient is not service connected.   Employment status:  Disabled (Comment on whether or not currently receiving Disability), Retired Nurse, adult, [place]  PT Recommendations:  Not assessed at this time Information / Referral to community resources:  [place]  Patient/Family's Response to care:  Patient is agreeable to transfer to the [place].   Patient/Family's Understanding of and Emotional Response to Diagnosis, Current Treatment, and Prognosis:  Patient was pleasant throughout assessment.   Emotional Assessment Appearance:  Appears stated age Attitude/Demeanor/Rapport:    Affect (typically observed):  Accepting, Adaptable, Pleasant Orientation:  Oriented to Self, Oriented to Place,  Oriented to  Time, Oriented to Situation Alcohol / Substance use:  Not Applicable Psych involvement (Current and /or in the community):  No (Comment)  Discharge Needs  Concerns to be addressed:  Discharge Planning Concerns Readmission within the last 30 days:  No Current discharge risk:  Dependent with Mobility Barriers to Discharge:  Continued Medical Work up   Timothy Freshwater, LCSW 08/11/2015, 1:38 PM

## 2015-08-11 NOTE — Progress Notes (Signed)
Initial Nutrition Assessment     INTERVENTION:  Meals and snacks: Cater to pt preferences    NUTRITION DIAGNOSIS:    (none at this time) related to   as evidenced by  .    GOAL:   Patient will meet greater than or equal to 90% of their needs    MONITOR:    (Energy intake)  REASON FOR ASSESSMENT:   Diagnosis    ASSESSMENT:      Pt admitted with CHF  Past Medical History  Diagnosis Date  . COPD (chronic obstructive pulmonary disease) (HCC)   . Renal disorder   . Hypertension   . Hyperlipemia   . Enlarged prostate     Current Nutrition: ate 100% of lunch today  Food/Nutrition-Related History: pt reports good appetite prior to admission   Scheduled Medications:  . antiseptic oral rinse  7 mL Mouth Rinse BID  . aspirin  81 mg Oral Daily  . atorvastatin  20 mg Oral QHS  . brimonidine  1 drop Both Eyes BID  . carbamazepine  100 mg Oral BID  . furosemide  40 mg Intravenous Q12H  . heparin  5,000 Units Subcutaneous 3 times per day  . lisinopril  5 mg Oral Daily  . metoprolol succinate  25 mg Oral Daily  . potassium chloride  20 mEq Oral Daily  . sodium chloride  3 mL Intravenous Q12H  . tamsulosin  0.4 mg Oral Daily  . tiotropium  18 mcg Inhalation Daily       Electrolyte/Renal Profile and Glucose Profile:   Recent Labs Lab 08/10/15 1031 08/10/15 1643 08/11/15 0429  NA 143  --  143  K 3.8  --  4.0  CL 106  --  105  CO2 28  --  33*  BUN 17  --  18  CREATININE 0.78  --  0.92  CALCIUM 9.0  --  8.8*  MG  --  2.0  --   GLUCOSE 160*  --  100*    Gastrointestinal Profile: Last BM: 12/20    Weight Change: stable wt    Diet Order:  Diet Heart Room service appropriate?: Yes; Fluid consistency:: Thin  Skin:   reviewed   Height:   Ht Readings from Last 1 Encounters:  08/10/15 6' (1.829 m)    Weight:   Wt Readings from Last 1 Encounters:  08/11/15 224 lb 14.4 oz (102.014 kg)    Ideal Body Weight:     BMI:  Body mass index is  30.5 kg/(m^2).   EDUCATION NEEDS:   No education needs identified at this time  LOW Care Level  Sabrina Arriaga B. Freida BusmanAllen, RD, LDN [phone] (pager) Weekend/On-Call pager [phone])

## 2015-08-12 ENCOUNTER — Encounter
Admission: RE | Admit: 2015-08-12 | Discharge: 2015-08-12 | Disposition: A | Discharge status: Home / Self Care | Payer: Commercial Managed Care - HMO | Source: Ambulatory Visit | Attending: Internal Medicine | Admitting: Internal Medicine

## 2015-08-12 LAB — BASIC METABOLIC PANEL
Anion gap: 7 (ref 5–15)
BUN: 20 mg/dL (ref 6–20)
CALCIUM: 9.2 mg/dL (ref 8.9–10.3)
CHLORIDE: 98 mmol/L — AB (ref 101–111)
CO2: 37 mmol/L — AB (ref 22–32)
CREATININE: 0.83 mg/dL (ref 0.61–1.24)
GFR calc Af Amer: >60 mL/min (ref >60)
GFR calc non Af Amer: >60 mL/min (ref >60)
Glucose, Bld: 112 mg/dL — ABNORMAL HIGH (ref 65–99)
Potassium: 4.4 mmol/L (ref 3.5–5.1)
SODIUM: 142 mmol/L (ref 135–145)

## 2015-08-12 MED ORDER — ALPRAZOLAM 0.25 MG PO TABS
0.2500 mg | ORAL_TABLET | Freq: Three times a day (TID) | ORAL | Status: DC | PRN
  Administered 2015-08-12 – 2015-08-13 (×2): 0.25 mg via ORAL
  Filled 2015-08-12 (×2): qty 1

## 2015-08-12 MED ORDER — ASPIRIN 81 MG PO CHEW: 81.0000 mg | CHEWABLE_TABLET | Freq: Every day | ORAL | Status: AC

## 2015-08-12 MED ORDER — FUROSEMIDE 40 MG PO TABS
40.0000 mg | ORAL_TABLET | Freq: Two times a day (BID) | ORAL | Status: DC
Start: 2015-08-12 — End: 2015-08-13
  Administered 2015-08-13: 40 mg via ORAL
  Filled 2015-08-12 (×2): qty 1

## 2015-08-12 MED ORDER — POTASSIUM CHLORIDE CRYS ER 20 MEQ PO TBCR: 20.0000 meq | EXTENDED_RELEASE_TABLET | Freq: Every day | ORAL | Status: AC

## 2015-08-12 MED ORDER — LISINOPRIL 5 MG PO TABS: 5.0000 mg | ORAL_TABLET | Freq: Every day | ORAL | Status: AC

## 2015-08-12 MED ORDER — FUROSEMIDE 40 MG PO TABS: 40.0000 mg | ORAL_TABLET | Freq: Two times a day (BID) | ORAL | Status: AC

## 2015-08-12 NOTE — Progress Notes (Signed)
Clinical Education officer, museum (CSW) made an Adult Scientist, forensic report in [place] due to the concerns about patient and his wife. CSW met with patient and his nephew Delfino Lovett was at bedside. CSW made patient and nephew aware of APS report. CSW also discussed D/C plan. MD is pursuing transfer to [facility]. If patient does not transfer to the [place] the plan is for him to go to rehab. CSW presented bed offers. Patient chose [facility]. CSW contacted [place] case manager and made her aware of above. Kim admissions coordinator at [facility] is aware of accepted bed offer. CSW will continue to follow and assist as needed.   Blima Rich, [place] [phone]

## 2015-08-12 NOTE — Progress Notes (Signed)
Pt c./o of unable to empty bladder/ bladder scan performed- 81375mls/ MD paged to make aware

## 2015-08-12 NOTE — Care Management Important Message (Signed)
Important Message  Patient Details  Name: Timothy Cabrera MRN: [id number] Date of Birth: 07-08-1922   Medicare Important Message Given:  Yes    Olegario MessierKathy A Candon Caras 08/12/2015, 10:16 AM

## 2015-08-12 NOTE — Progress Notes (Addendum)
[facility] [place] [facility]   PATIENT NAME: Timothy Cabrera    MR#:  [id number]  DATE OF BIRTH:  [birth year]  DATE OF ADMISSION:  08/10/2015 ADMITTING PHYSICIAN: Shaune Pollack, MD  DATE OF DISCHARGE: 08/12/15  PRIMARY CARE PHYSICIAN: Jerl Mina, MD    ADMISSION DIAGNOSIS:  Acute pulmonary edema (HCC) [J81.0]  DISCHARGE DIAGNOSIS:  Acute pulmonary edema secondary to new onset acute congestive test tolerate heart failure. Bilateral severe lower extremity edema with venous ulcers  SECONDARY DIAGNOSIS:   Past Medical History  Diagnosis Date  . COPD (chronic obstructive pulmonary disease) (HCC)   . Renal disorder   . Hypertension   . Hyperlipemia   . Enlarged prostate     HOSPITAL COURSE:  Timothy Cabrera is a 81 y.o. male with a known history of COPD, chronic respiratory failure on home oxygen 2 L and hypertension. The patient was sent to ED from home due to above chief complaint. He is alert, awake and oriented. But he is hard of hearing and a little demented.   1. Acute diastolic CHF with pulmonary edema and cardiomegaly noted on chest x-ray  He has been treated with Lasix 40 mg IV twice a day  patient tolerating diuresing well.Continue lisinopril and by mouth aspirin.   echocardiogram showing ejection fraction 55-60%. Continue current treatment per Dr Cassie Freer.  2. Bilateral severe lower extremity edema with venous ulcers. Patient likely has underlying peripheral vascular disease. -White count normal. No evidence of cellulitis. -wound care.  3.Chronic respiratory failure on home oxygen. Nebulizer when necessary.  4. Hypertension. Continue Lopressor and add lisinopril.  5. Hyperlipidemia.  -On statins  6. BPH On Flomax   Dr Alison Murray at [facility] and has accepted pt, waiting for transfer to [facility]  CONSULTS OBTAINED:  Treatment Team:  Marcina Millard, MD Dalia Heading, MD  DRUG ALLERGIES:  No Known Allergies  DISCHARGE  MEDICATIONS:   Current Discharge Medication List    START taking these medications   Details  aspirin 81 MG chewable tablet Chew 1 tablet (81 mg total) by mouth daily. Qty: 30 tablet, Refills: 0    lisinopril (PRINIVIL,ZESTRIL) 5 MG tablet Take 1 tablet (5 mg total) by mouth daily. Qty: 30 tablet, Refills: 0    potassium chloride SA (K-DUR,KLOR-CON) 20 MEQ tablet Take 1 tablet (20 mEq total) by mouth daily. Qty: 60 tablet, Refills: 0      CONTINUE these medications which have CHANGED   Details  furosemide (LASIX) 40 MG tablet Take 1 tablet (40 mg total) by mouth 2 (two) times daily. Qty: 30 tablet, Refills: 0      CONTINUE these medications which have NOT CHANGED   Details  atorvastatin (LIPITOR) 20 MG tablet Take 20 mg by mouth at bedtime.    brimonidine (ALPHAGAN P) 0.1 % SOLN Place 1 drop into both eyes 2 (two) times daily.    carbamazepine (TEGRETOL) 100 MG chewable tablet Chew 100 mg by mouth 2 (two) times daily.    metoprolol succinate (TOPROL-XL) 25 MG 24 hr tablet Take 25 mg by mouth daily.    tamsulosin (FLOMAX) 0.4 MG CAPS capsule Take 0.4 mg by mouth daily.    tiotropium (SPIRIVA) 18 MCG inhalation capsule Place 18 mcg into inhaler and inhale daily.        If you experience worsening of your admission symptoms, develop shortness of breath, life threatening emergency, suicidal or homicidal thoughts you must seek medical attention immediately by calling 911 or calling your  MD immediately  if symptoms less severe.  You Must read complete instructions/literature along with all the possible adverse reactions/side effects for all the Medicines you take and that have been prescribed to you. Take any new Medicines after you have completely understood and accept all the possible adverse reactions/side effects.   Please note  You were cared for by a hospitalist during your hospital stay. If you have any questions about your discharge medications or the care you received  while you were in the hospital after you are discharged, you can call the unit and asked to speak with the hospitalist on call if the hospitalist that took care of you is not available. Once you are discharged, your primary care physician will handle any further medical issues. Please note that NO REFILLS for any discharge medications will be authorized once you are discharged, as it is imperative that you return to your primary care physician (or establish a relationship with a primary care physician if you do not have one) for your aftercare needs so that they can reassess your need for medications and monitor your lab values. Today   SUBJECTIVE   Shortness of breath, leg edema is better.  VITAL SIGNS:  Blood pressure 108/62, pulse 61, temperature 97.3 F (36.3 C), temperature source Oral, resp. rate 24, height 6' (1.829 m), weight 101.47 kg (223 lb 11.2 oz), SpO2 98 %.  I/O:    Intake/Output Summary (Last 24 hours) at 08/12/15 1738 Last data filed at 08/12/15 1500  Gross per 24 hour  Intake    600 ml  Output   3091 ml  Net  -2491 ml    PHYSICAL EXAMINATION:  GENERAL:  81 y.o.-year-old patient lying in the bed with no acute distress.  EYES: Pupils equal, round, reactive to light and accommodation. No scleral icterus. Extraocular muscles intact.  HEENT: Head atraumatic, normocephalic. Oropharynx and nasopharynx clear.  NECK:  Supple, no jugular venous distention. No thyroid enlargement, no tenderness.  LUNGS: Distant breath sounds bilaterally, no wheezing, mild basilar rales. No use of accessory muscles of respiration.  CARDIOVASCULAR: S1, S2 normal. No murmurs, rubs, or gallops.  ABDOMEN: Soft, non-tender, non-distended. Bowel sounds present. No organomegaly or mass.  EXTREMITIES: Bilateral 3+ pitting edema with significant dry and scaly skin. Patient has several venous ulcers over both his lower extremities. No evidence of cellulitis. Slough present. NEUROLOGIC: Cranial nerves II  through XII are intact. Muscle strength 4/5 in all extremities. Sensation intact. Gait not checked.  PSYCHIATRIC: The patient is alert and oriented x 3.  SKIN: No obvious rash, lesion, or ulcer.   DATA REVIEW:   CBC   Recent Labs Lab 08/10/15 1031  WBC 5.6  HGB 15.2  HCT 47.1  PLT 114*    Chemistries   Recent Labs Lab 08/10/15 1643  08/12/15 0427  NA  --   < > 142  K  --   < > 4.4  CL  --   < > 98*  CO2  --   < > 37*  GLUCOSE  --   < > 112*  BUN  --   < > 20  CREATININE  --   < > 0.83  CALCIUM  --   < > 9.2  MG 2.0  --   --   < > = values in this interval not displayed.  Microbiology Results   No results found for this or any previous visit (from the past 240 hour(s)).  RADIOLOGY:  No results found.  Management plans discussed with the patient, his wife and other family members and they are in agreement.  CODE STATUS:     Code Status Orders        Start     Ordered   08/10/15 1630  Full code   Continuous     08/10/15 1629      TOTAL TIME TAKING CARE OF THIS PATIENT: 42 minutes.    Shaune Pollack M.D on 08/12/2015 at 5:38 PM  Between 7am to 6pm - Pager - [phone] After 6pm go to [email or web address] - password [email or web address] [facility]  [place] [facility]  Office  [phone]  CC: Primary care physician; Jerl Mina, MD

## 2015-08-12 NOTE — Progress Notes (Signed)
Dr. Imogene Burnhen paged to make aware of episodes of pauses on tele and hr sustaining in the 50s/ pt asymptomatic/ will continue to monitor.

## 2015-08-12 NOTE — Care Management (Signed)
[facility] informing [facility]- just waiting for an available bed.  Facility is not on diversion.

## 2015-08-13 LAB — BASIC METABOLIC PANEL
Anion gap: 7 (ref 5–15)
BUN: 17 mg/dL (ref 6–20)
CHLORIDE: 95 mmol/L — AB (ref 101–111)
CO2: 32 mmol/L (ref 22–32)
CREATININE: 0.68 mg/dL (ref 0.61–1.24)
Calcium: 8.3 mg/dL — ABNORMAL LOW (ref 8.9–10.3)
GFR calc non Af Amer: >60 mL/min (ref >60)
Glucose, Bld: 104 mg/dL — ABNORMAL HIGH (ref 65–99)
POTASSIUM: 3.4 mmol/L — AB (ref 3.5–5.1)
Sodium: 134 mmol/L — ABNORMAL LOW (ref 135–145)

## 2015-08-13 NOTE — Progress Notes (Signed)
Report called to Chancy HurterBronco Suzuki from [facility], pt awaiting [facility] for transport.

## 2015-08-13 NOTE — Progress Notes (Signed)
Pt currently being [facility] via [facility].

## 2015-08-13 NOTE — Discharge Summary (Signed)
[facility] - [place] at [facility]   PATIENT NAME: Timothy Cabrera    MR#:  [id number]  DATE OF BIRTH:  November 02, 1921  DATE OF ADMISSION:  08/10/2015 ADMITTING PHYSICIAN: Shaune Pollack, MD  DATE OF DISCHARGE: 08/13/2015  PRIMARY CARE PHYSICIAN: Jerl Mina, MD    ADMISSION DIAGNOSIS:  Acute pulmonary edema (HCC) [J81.0]   DISCHARGE DIAGNOSIS:  Acute pulmonary edema secondary to new onset acute congestive test tolerate heart failure. Bilateral severe lower extremity edema with venous ulcers  SECONDARY DIAGNOSIS:   Past Medical History  Diagnosis Date  . COPD (chronic obstructive pulmonary disease) (HCC)   . Renal disorder   . Hypertension   . Hyperlipemia   . Enlarged prostate     HOSPITAL COURSE:   Gerrad Welker is a 81 y.o. male with a known history of COPD, chronic respiratory failure on home oxygen 2 L and hypertension. The patient was sent to ED from home due to above chief complaint. He is alert, awake and oriented. But he is hard of hearing and a little demented.   1. Acute diastolic CHF with pulmonary edema and cardiomegaly noted on chest x-ray  He has been treated with Lasix 40 mg IV twice a day patient tolerating diuresing well.Continue lisinopril and by mouth aspirin.  echocardiogram showing ejection fraction 55-60%. Continue current treatment per Dr Cassie Freer.  2. Bilateral severe lower extremity edema with venous ulcers. Patient likely has underlying peripheral vascular disease. -White count normal. No evidence of cellulitis. -wound care.  3.Chronic respiratory failure on home oxygen. Nebulizer when necessary.  4. Hypertension. Continue Lopressor and add lisinopril.  5. Hyperlipidemia.  -On statins  6. BPH On Flomax  Dr Alison [facility] at [facility] and has accepted pt, bed is available today. will be transferred to [place].  DISCHARGE CONDITIONS:   Guarded, discharge to [facility] hospital today.  CONSULTS OBTAINED:  Treatment Team:   Marcina Millard, MD Dalia Heading, MD  DRUG ALLERGIES:  No Known Allergies  DISCHARGE MEDICATIONS:   Current Discharge Medication List    START taking these medications   Details  aspirin 81 MG chewable tablet Chew 1 tablet (81 mg total) by mouth daily. Qty: 30 tablet, Refills: 0    lisinopril (PRINIVIL,ZESTRIL) 5 MG tablet Take 1 tablet (5 mg total) by mouth daily. Qty: 30 tablet, Refills: 0    potassium chloride SA (K-DUR,KLOR-CON) 20 MEQ tablet Take 1 tablet (20 mEq total) by mouth daily. Qty: 60 tablet, Refills: 0      CONTINUE these medications which have CHANGED   Details  furosemide (LASIX) 40 MG tablet Take 1 tablet (40 mg total) by mouth 2 (two) times daily. Qty: 30 tablet, Refills: 0      CONTINUE these medications which have NOT CHANGED   Details  atorvastatin (LIPITOR) 20 MG tablet Take 20 mg by mouth at bedtime.    brimonidine (ALPHAGAN P) 0.1 % SOLN Place 1 drop into both eyes 2 (two) times daily.    carbamazepine (TEGRETOL) 100 MG chewable tablet Chew 100 mg by mouth 2 (two) times daily.    metoprolol succinate (TOPROL-XL) 25 MG 24 hr tablet Take 25 mg by mouth daily.    tamsulosin (FLOMAX) 0.4 MG CAPS capsule Take 0.4 mg by mouth daily.    tiotropium (SPIRIVA) 18 MCG inhalation capsule Place 18 mcg into inhaler and inhale daily.         DISCHARGE INSTRUCTIONS:    If you experience worsening of your admission symptoms, develop shortness of  breath, life threatening emergency, suicidal or homicidal thoughts you must seek medical attention immediately by calling 911 or calling your MD immediately  if symptoms less severe.  You Must read complete instructions/literature along with all the possible adverse reactions/side effects for all the Medicines you take and that have been prescribed to you. Take any new Medicines after you have completely understood and accept all the possible adverse reactions/side effects.   Please note  You were cared for  by a hospitalist during your hospital stay. If you have any questions about your discharge medications or the care you received while you were in the hospital after you are discharged, you can call the unit and asked to speak with the hospitalist on call if the hospitalist that took care of you is not available. Once you are discharged, your primary care physician will handle any further medical issues. Please note that NO REFILLS for any discharge medications will be authorized once you are discharged, as it is imperative that you return to your primary care physician (or establish a relationship with a primary care physician if you do not have one) for your aftercare needs so that they can reassess your need for medications and monitor your lab values.    Today   SUBJECTIVE  Feels better, still has cough and SOB.   VITAL SIGNS:  Blood pressure 132/93, pulse 79, temperature 98 F (36.7 C), temperature source Oral, resp. rate 20, height 6' (1.829 m), weight 99.655 kg (219 lb 11.2 oz), SpO2 91 %.  I/O:   Intake/Output Summary (Last 24 hours) at 08/13/15 0908 Last data filed at 08/13/15 [id number]  Gross per 24 hour  Intake    360 ml  Output   1566 ml  Net  -1206 ml    PHYSICAL EXAMINATION:  GENERAL:  81 y.o.-year-old patient lying in the bed with no acute distress.  EYES: Pupils equal, round, reactive to light and accommodation. No scleral icterus. Extraocular muscles intact.  HEENT: Head atraumatic, normocephalic. Oropharynx and nasopharynx clear.  NECK:  Supple, no jugular venous distention. No thyroid enlargement, no tenderness.  LUNGS: Normal breath sounds bilaterally, no wheezing, bilateral basilar rales. No use of accessory muscles of respiration.  CARDIOVASCULAR: S1, S2 normal. No murmurs, rubs, or gallops.  ABDOMEN: Soft, non-tender, non-distended. Bowel sounds present. No organomegaly or mass.  EXTREMITIES: No pedal edema, cyanosis, or clubbing. Bilateral leg in dressing for  ulcers. NEUROLOGIC: Cranial nerves II through XII are intact. Muscle strength 4/5 in all extremities. Sensation intact. Gait not checked.  PSYCHIATRIC: The patient is alert and oriented x 3.  SKIN: No obvious rash, lesion, or ulcer.   DATA REVIEW:   CBC  Recent Labs Lab 08/10/15 1031  WBC 5.6  HGB 15.2  HCT 47.1  PLT 114*    Chemistries   Recent Labs Lab 08/10/15 1643  08/13/15 0511  NA  --   < > 134*  K  --   < > 3.4*  CL  --   < > 95*  CO2  --   < > 32  GLUCOSE  --   < > 104*  BUN  --   < > 17  CREATININE  --   < > 0.68  CALCIUM  --   < > 8.3*  MG 2.0  --   --   < > = values in this interval not displayed.  Cardiac Enzymes  Recent Labs Lab 08/11/15 0429  TROPONINI 0.03    Microbiology Results  No results found for this or any previous visit.  RADIOLOGY:  No results found.      Management plans discussed with the patient, family and they are in agreement.  CODE STATUS:     Code Status Orders        Start     Ordered   08/10/15 1630  Full code   Continuous     08/10/15 1629      TOTAL TIME TAKING CARE OF THIS PATIENT: 35 minutes.    Shaune Pollack M.D on 08/13/2015 at 9:08 AM  Between 7am to 6pm - Pager - [phone]  After 6pm go to [email or web address] - password [email or web address] [facility]  [place] [facility]  Office  [phone]  CC: Primary care physician; Jerl Mina, MD

## 2015-08-13 NOTE — Care Management (Signed)
In[facility] that [facility] last night but he declined transfer due to the hour.  The note entered by the primary nurse was entered at 12:47 AM.   Spoke with Peyton NajjarLarry at the transfer center and he informed CM that facility is awaiting arrival of patient.  Patient is to go to  unit 7B.  Accepting MD is  Dr Alison MurrayMcManigle. Patient is in agreement with transfer.  Provided primary nurse with phone number to call report.  Adult protective services is going to attempt to assess patient this morning prior to transfer and informed adult protective services will also be making a home visit today to assess patient's wife. CM will reach out to [facility] CM and Child psychotherapistsocial worker for continuity of care planning for this patient and his wife.  They both most likely will require round the clock in home support or facility placement

## 2015-08-13 NOTE — Progress Notes (Signed)
Patient is transferring to the [facility]. [facility] [place] [facility] worker [phone] came to visit patient and family today at the hospital.   Jetta LoutBailey Morgan, LCSWA [phone]

## 2015-08-13 NOTE — Progress Notes (Signed)
Brad  Warehouse manager( administrator) from [facility] hospital just called stated that  a bed is available for patient right now. Nida BoatmanBrad indicated that if patient doesn't want to come today, he can come in the morning. He indicated that we should call to this number [phone] with the extension  # [phone] for report. The patient refused to go tonight stated it's too late. No acute distress noted, patient denied pain.

## 2015-09-23 DEATH — deceased

## 2015-12-14 IMAGING — CR DG ABDOMEN 1V
1 series · 2 of 2 positions shown · non-contrast
Comparison: None.

CLINICAL DATA: Evaluate for nephrolithiasis

EXAM:
ABDOMEN - 1 VIEW

[Series 1: supine kub · 0.17mm/px · 2 of 2 slices shown]
[im 1/2]
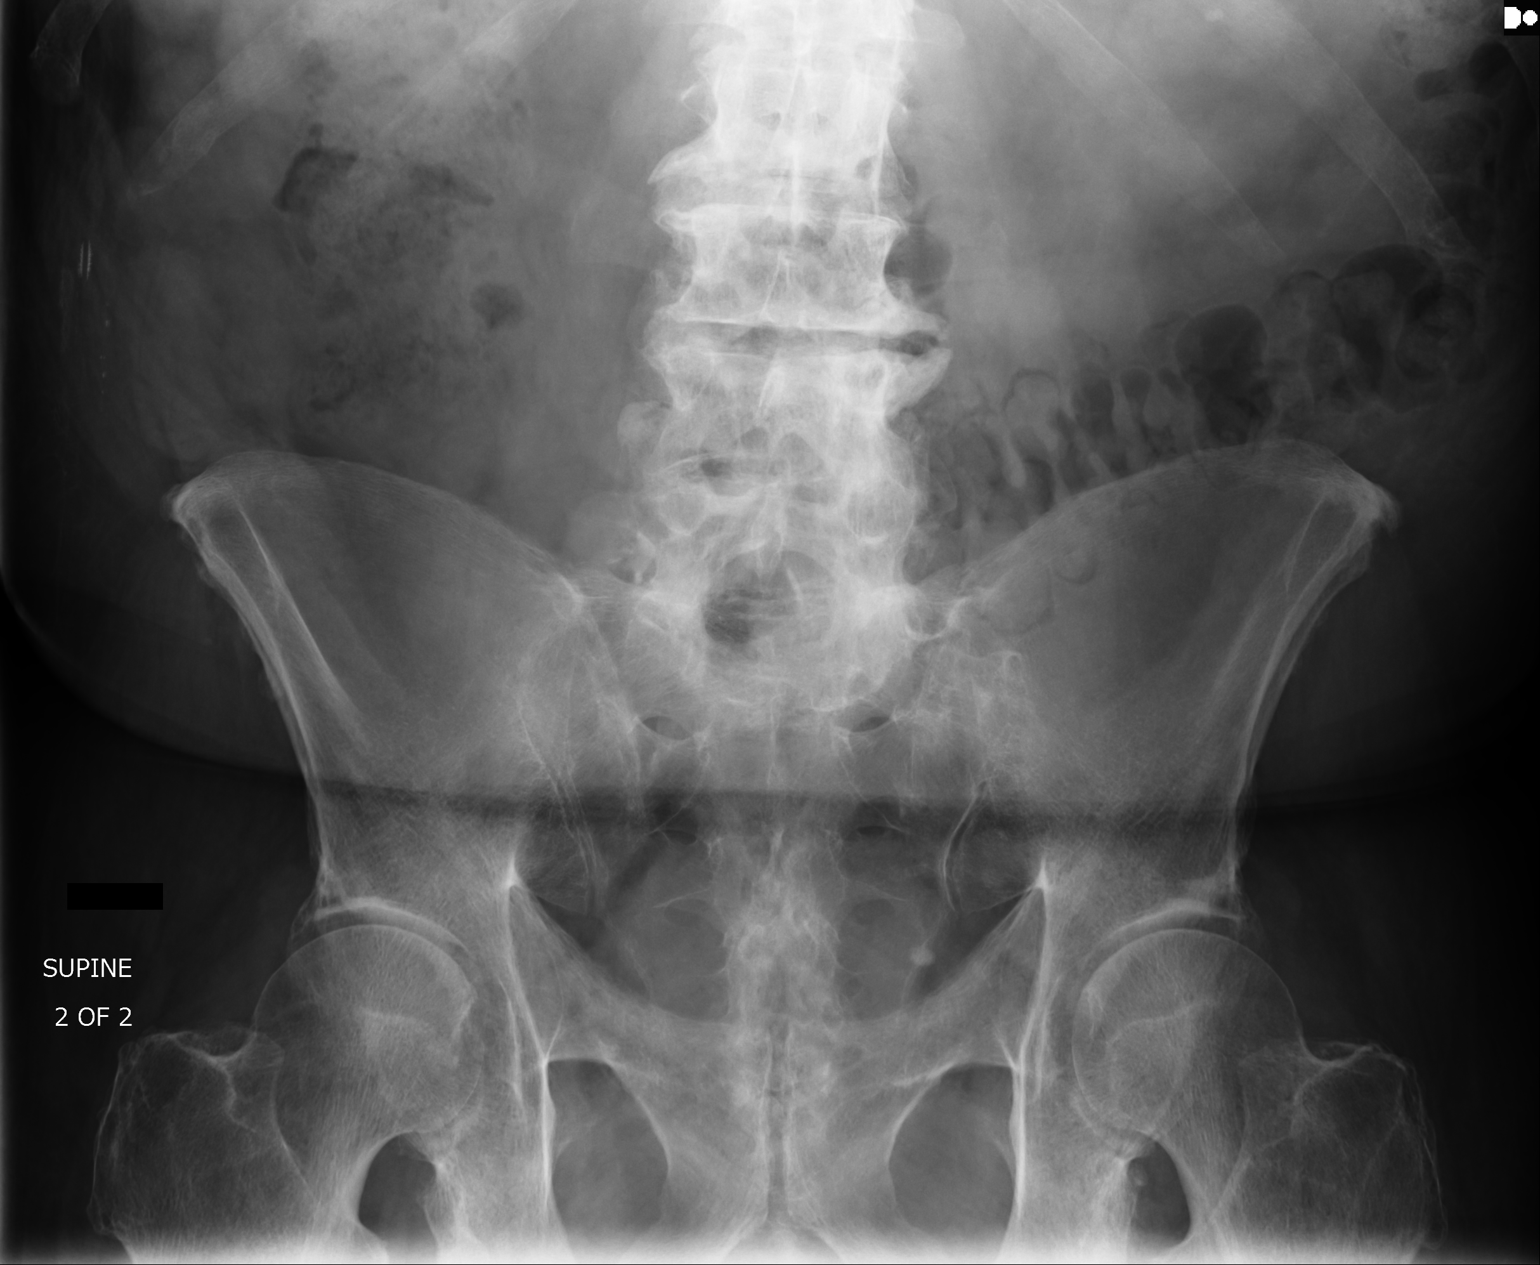
[im 2/2]
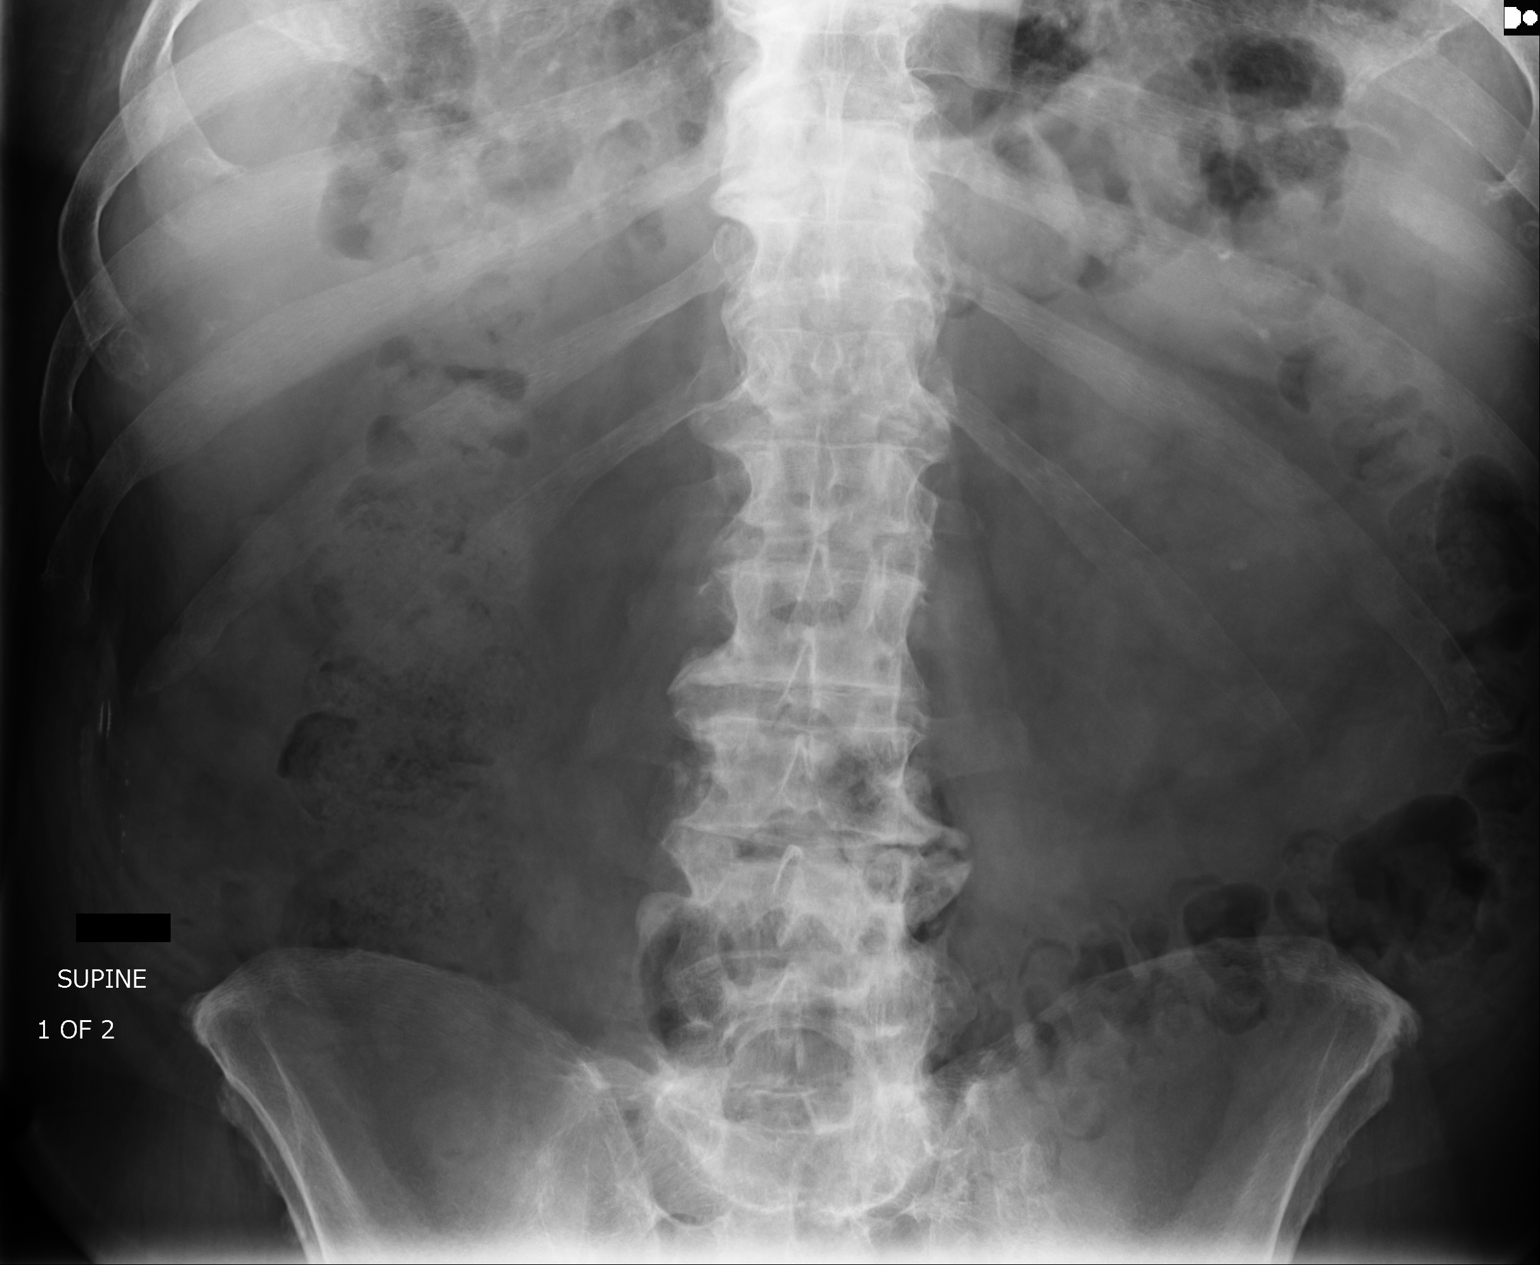

[2 of 2 positions shown; findings below may reference images not displayed]

FINDINGS: The bowel gas pattern is normal. There are small stones within the
left kidney unchanged. There is a 6 mm calcific density in the left
pelvis, a distal ureteral stone is not excluded. Degenerative joint
changes of the spine are noted. There is mild to moderate stool
burden within the colon.
IMPRESSION: Small stones within the left kidney. 6 mm calcific density in the
left pelvis a distal left ureteral stone is not excluded.

## 2017-07-08 IMAGING — CR DG CHEST 1V PORT
1 series · 1 of 1 positions shown · non-contrast
Comparison: 10/30/2013 .

CLINICAL DATA: Weakness.  Edema.

EXAM:
PORTABLE CHEST 1 VIEW

[ap]
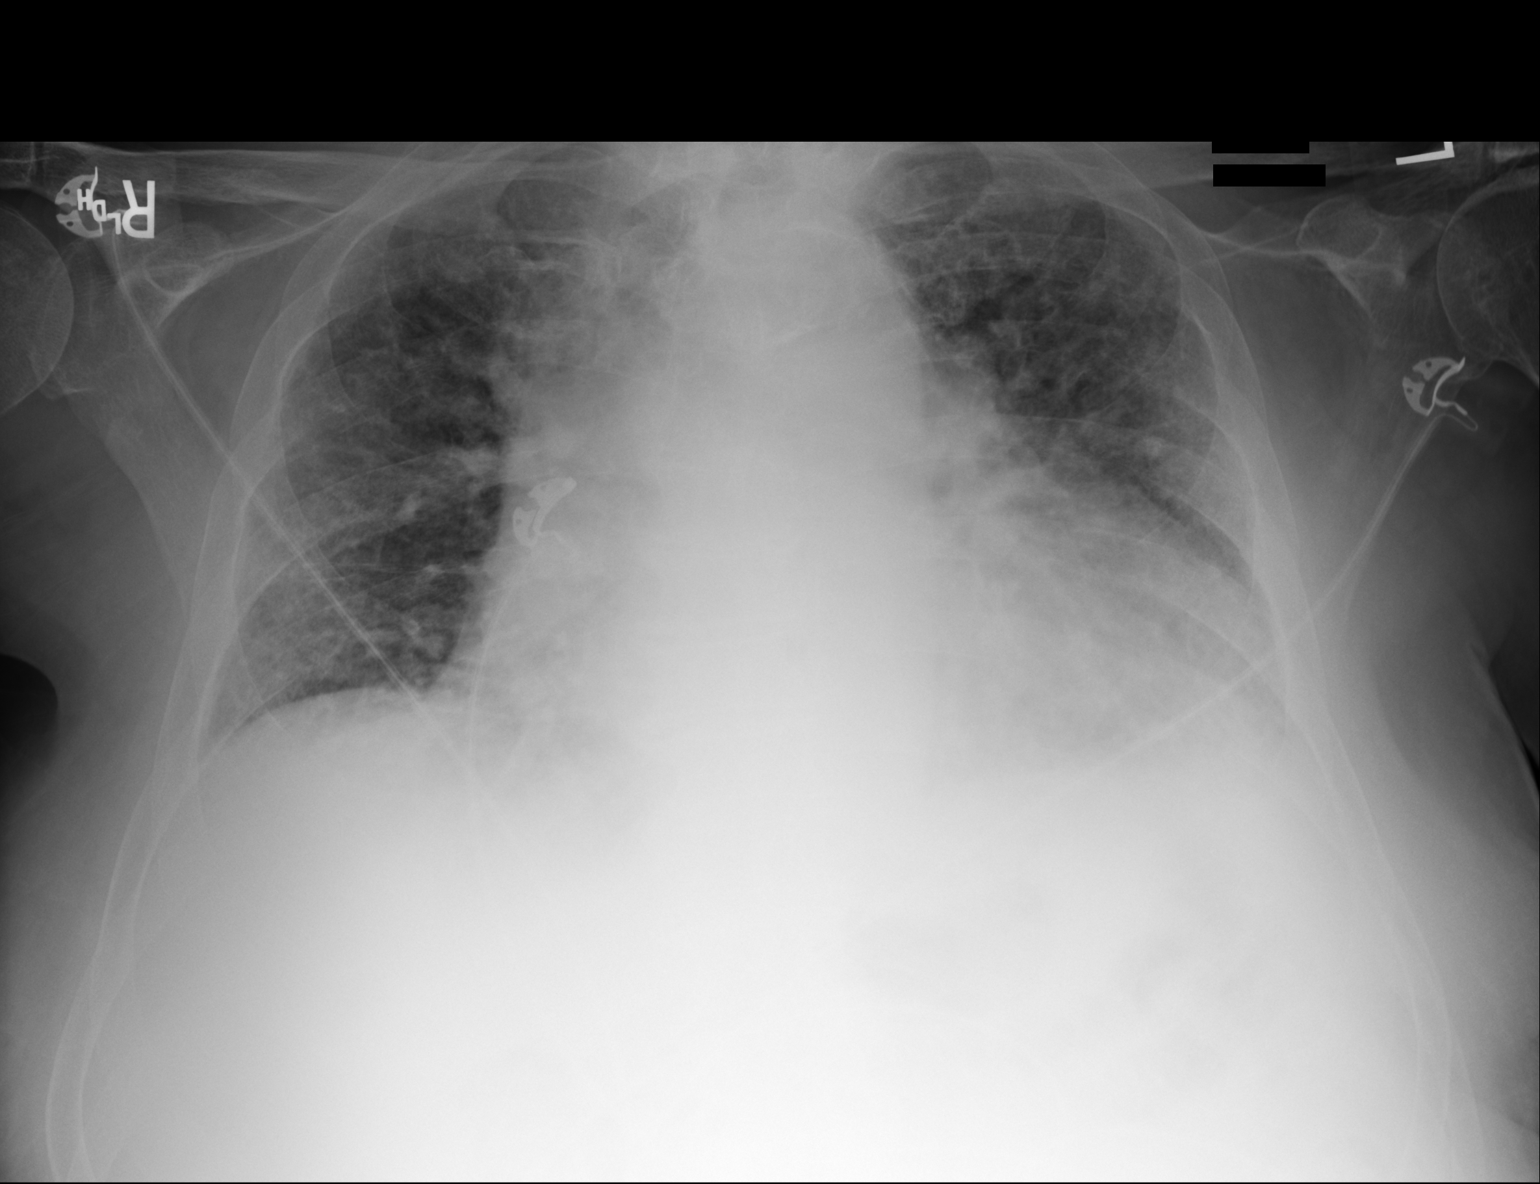

[1 of 1 positions shown; findings below may reference images not displayed]

FINDINGS: Cardiomegaly with pulmonary vascular prominence and bilateral
interstitial prominence noted consistent congestive heart failure.
Small left pleural effusion cannot be excluded. Low lung volumes. No
pneumothorax.
IMPRESSION: Congestive heart failure with pulmonary interstitial edema. Small
left pleural effusion cannot be excluded.
